# Patient Record
Sex: Female | Born: 1975 | Race: White | Hispanic: No | Marital: Married | Smoking: Former smoker
Health system: Southern US, Community
[De-identification: ages and names within clinical notes are randomized; demographics above are authoritative.]

## PROBLEM LIST (undated history)

## (undated) DIAGNOSIS — R002 Palpitations: Secondary | ICD-10-CM

## (undated) DIAGNOSIS — IMO0002 Reserved for concepts with insufficient information to code with codable children: Secondary | ICD-10-CM

## (undated) DIAGNOSIS — I493 Ventricular premature depolarization: Secondary | ICD-10-CM

## (undated) DIAGNOSIS — Z789 Other specified health status: Secondary | ICD-10-CM

## (undated) HISTORY — PX: NO PAST SURGERIES: SHX2092

## (undated) HISTORY — DX: Ventricular premature depolarization: I49.3

## (undated) HISTORY — DX: Palpitations: R00.2

---

## 2002-08-26 DIAGNOSIS — S0570XA Avulsion of unspecified eye, initial encounter: Secondary | ICD-10-CM | POA: Insufficient documentation

## 2004-01-07 ENCOUNTER — Other Ambulatory Visit: Admission: RE | Admit: 2004-01-07 | Discharge: 2004-01-07 | Payer: Self-pay | Admitting: Obstetrics and Gynecology

## 2005-02-09 ENCOUNTER — Other Ambulatory Visit: Admission: RE | Admit: 2005-02-09 | Discharge: 2005-02-09 | Payer: Self-pay | Admitting: Obstetrics and Gynecology

## 2007-07-03 ENCOUNTER — Ambulatory Visit: Payer: Self-pay | Admitting: Internal Medicine

## 2007-07-10 ENCOUNTER — Ambulatory Visit: Payer: Self-pay

## 2007-07-10 ENCOUNTER — Encounter: Payer: Self-pay | Admitting: Internal Medicine

## 2007-08-27 ENCOUNTER — Ambulatory Visit: Payer: Self-pay | Admitting: Internal Medicine

## 2009-04-13 ENCOUNTER — Encounter: Payer: Self-pay | Admitting: Internal Medicine

## 2009-07-13 ENCOUNTER — Ambulatory Visit: Payer: Self-pay | Admitting: Internal Medicine

## 2009-07-13 DIAGNOSIS — R002 Palpitations: Secondary | ICD-10-CM | POA: Insufficient documentation

## 2009-07-13 DIAGNOSIS — I4949 Other premature depolarization: Secondary | ICD-10-CM

## 2010-11-10 LAB — RUBELLA ANTIBODY, IGM: Rubella: NON-IMMUNE/NOT IMMUNE

## 2010-11-10 LAB — HIV ANTIBODY (ROUTINE TESTING W REFLEX): HIV: NONREACTIVE

## 2010-11-10 LAB — ABO/RH

## 2011-01-18 NOTE — Progress Notes (Signed)
[facility] OFFICE NOTE   NAME:Erica Carpenter, Erica Carpenter                         MRN:          [id number]  DATE:08/27/2007                            DOB:          Mar 15, 1976    The patient returns today for follow-up.  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  I saw her initially back in October.  At that  time we talked about the most likely benign nature and etiology of her  symptoms and had her undergo echocardiography to help [place] exclude  arrhythmogenic right ventricle dysplasia.  Of course, the patient has  never had syncope and has very minimal palpitations.  Two-dimensional  echocardiogram was subsequently obtained and demonstrated normal RV as  well as LV function.  She had no other specific complaints today.  I  would note that the transition from negative to positive of her PVC's  occurred at lead V3.  Of course they were strongly positive in the  inferior leads as well.  The patient is on no medications.  With regard  to her PVC's, she is on oral contraceptive pills.   PHYSICAL EXAMINATION:  GENERAL:  She is a pleasant well-appearing young  woman in no distress.  VITAL SIGNS:  Blood pressure 116/78, pulse 65 and regular, respirations  18, weight 121 pounds.  NECK:  No jugular venous distention.  LUNGS:  Clear to auscultation bilaterally.  No wheezes, rales or rhonchi  were present. CARDIOVASCULAR:  Regular rate and rhythm with normal S1  and S2.  No murmurs, rubs, or gallops present.  EXTREMITIES:  No edema bilaterally.  ABDOMEN:  Soft and nontender.  SKIN:  Normal.   IMPRESSION:  1. Palpitations.  2. Premature ventricular complexes coming from the RV outflow tract      region.   DISCUSSION:  Overall the patient is stable.  I have again reinforced the  benign etiology of her palpitations.  Should she have any  worsening  symptoms including syncope, obviously we would expect her to call us.  Otherwise, I will plan to see her back on a p.r.n. basis.  At this  point, the next treatment option would be a beta blocker, but because of  the minimal nature of her symptoms, I think the side effects of beta  blockers outweigh the benefit at this point in time.     Doylene Canning. Ladona Ridgel, MD  Electronically Signed    GWT/MedQ  DD: 08/27/2007  DT: 08/28/2007  Job #: [id number]   cc:   Dineen Kid. Rana Snare, M.D.

## 2011-01-18 NOTE — Letter (Signed)
July 03, 2007    Dineen Kid. Rana Snare, M.D.  [address]  [place], [place] [address]   RE:  Erica, Carpenter  MRN:  [id number]  /  DOB:  1975/12/25   Dear Dr. Rana Snare:   Thank you for referring Erica Carpenter for EP evaluation of fairly  frequent ventricular ectopy.  The patient is a very pleasant 35 year old  woman as you know, who has otherwise been healthy but was noted in your  office to have an irregular heartbeat.  She is minimally symptomatic  with this and notes that her only real symptom occurs when she is lying  in her bed at night time and notes that her heart beats irregularly.  Other than this though she does not have sensation of palpitations and  denies chest pain or shortness of breath and has never had syncope.  She  is very active and actually works out several times a week, doing  Runner, broadcasting/film/video and cardiovascular exercises.  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  She has no signs of  syncope and otherwise has been stable.   Her physical exam was fairly unremarkable except that she had fairly  frequent irregular heartbeats in a typically trigeminal pattern.  Her  EKG demonstrates sinus rhythm with ventricular ectopy.  The morphology  of her PVCs is that it transitions from a negative QRS to a positive QRS  in lead V3, and that the inferior leads are markedly positive consistent  with ectopy originating from the right ventricular outflow tract.   For most patients who have ectopy originating from the RV outflow tract,  they are relatively symptomatic with the exception of palpitations.  Most patients specifically are not at risk for sudden cardiac death.  With all of the above I have recommended that the patient undergo 2-D  echocardiography to help assess her risks.  Rarely patients with PVCs  originating from the RV outflow tract will ultimately end up having  arrhythmogenic right ventricular dysplasia which is a  fibrofatty  infiltration of the right ventricle.  The likelihood of this in this  patient is quite low.  Will use the echo as an initial screening tool.  If her echocardiogram is unremarkable then I will plan on seeing her  back in several months to see how she is doing and then basically on a  yearly basis thereafter until we see more or less evidence of  symptomatic arrhythmias.  Obviously if she has evidence of LV or RV  dysfunction, then additional evaluation would be required.  Thank you  for referring Erica Carpenter for EP evaluation.  I will keep you apprised of  any additional developments.    Sincerely,      Erica Carpenter. Ladona Ridgel, MD  Electronically Signed    GWT/MedQ  DD: 07/03/2007  DT: 07/03/2007  Job #: [id number]

## 2011-01-18 NOTE — Letter (Signed)
August 27, 2007    Dineen Kid. Rana Snare, MD.  [address]  [place], [place], [address].   RE:  Erica Carpenter, Erica Carpenter  MRN:  [id number]  /  DOB:  1976-08-12   Dear Dr. Rana Snare:   Today we saw Ms. Erica Carpenter back for EP followup.  As you know, she is  a very pleasant, 35 year old woman with a history of palpitations, an  irregular heartbeat, and documented PVCs coming from the RV outflow  tract region.  The patient had been and continues to be fairly minimally  symptomatic from this.  She has not had syncope, she denies chest pain  or shortness of breath.  When I initially saw her back in October, I  recommended that we undergo a two-D echocardiography to exclude an  occult erythemogenic right ventricular dysplasia (fatty infiltration of  the right ventricle) and this was carried out.  Her two-D echo  demonstrated no RV or LV dysfunction.  In addition, her chamber sizes  were all normal and there were no regional wall motion abnormalities.  All in all, a negative exam.  Today she returns for followup and her  exam is unremarkable.  Her symptoms are minimal.   I have discussed the issues with regard to treatment with [name] as  well as the expectation long term for this process.  For the most part,  patients like [name] will do well with occasional flares.  For now,  I have recommended no medical therapy, although a beta blocker if her  symptoms were to worsen would be a strong consideration.  Because her  symptoms are fairly infrequent at the present, I think a beta blocker is  unnecessary.  I will see her back on a p.r.n. basis.  Please do not  hesitate to contact me for any additional questions or problems with Ms.  Treichler.  Thanks again for referring her for EP evaluation.    Sincerely,      Doylene Canning. Ladona Ridgel, MD  Electronically Signed    GWT/MedQ  DD: 08/27/2007  DT: 08/27/2007  Job #: [id number]

## 2011-01-18 NOTE — Progress Notes (Signed)
[facility] OFFICE NOTE   NAME:WILSONAnalayah, Brooke                         MRN:          [id number]  DATE:07/03/2007                            DOB:          1976-03-31    The patient is referred today by Dineen Kid. Rana Snare, M.D. for evaluation of  an irregular heartbeat discovered on routine examination.   HISTORY OF PRESENT ILLNESS:  The patient is a 35 year old woman who has  otherwise been healthy.  She notes that when she lays in bed at night,  sometimes she will feel rare irregular heart beats, but has otherwise  been stable.  She denies chest pain, shortness of breath, or syncope.  The patient was seen by her obstetrician several days ago and was noted  to have an irregular heart beat and was at that time in what was thought  to be a bigeminal rhythm.  She is now referred for additional  evaluation.   FAMILY HISTORY:  Notable for a mother who I follow who has heart disease  and arrhythmias and a father who is otherwise alive and healthy.  She  has two brothers who are alive and healthy otherwise.   MEDICATIONS:  Oral contraceptive pills.   SOCIAL HISTORY:  The patient has a history of tobacco use, but stopped  smoking over seven years ago.  She has a rare glass of wine on daily  basis.  She drinks one cup of coffee per day.  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   REVIEW OF SYSTEMS:  Unremarkable.  All systems reviewed and found to be  negative except as noted above.   PHYSICAL EXAMINATION:  GENERAL:  She is a pleasant, well-appearing, 37-  year-old woman in no acute distress.  VITAL SIGNS:  Blood pressure 110/76, pulse was 74 and irregular.  The  respirations were 18.  Weight was 113 pounds.  HEENT:  Normocephalic and atraumatic.  Pupils equal, round, and reactive  to light.  Oropharynx was moist.  Sclerae anicteric.  NECK:  No jugular venous distention.   There was no thyromegaly.  Trachea  was midline.  Carotids were 2+ and symmetric.  LUNGS:  Clear to auscultation bilaterally.  No wheezes, rales, or  rhonchi were present.  There is no increased work of breathing.  CARDIOVASCULAR:  Irregular rate and rhythm with a normal S1 and S2.  The  PMI was not enlarged, nor was it laterally displaced.  ABDOMEN:  Soft, nontender, and nondistended.  There was no organomegaly.  The bowel sounds were present.  There was no rebound or guarding.  EXTREMITIES:  No cyanosis, clubbing, or edema.  Pulses were 2+ and  symmetric.  NEUROLOGY:  Alert and oriented x3.  Cranial nerves II-XII grossly  intact.  Strength is 5/5 and symmetric.   The EKG demonstrates sinus rhythm with frequent PVC's from the RV  outflow tract region.  The transition was negative to positive at lead  V2 going to lead V3.   IMPRESSION:  Minimally symptomatic PVC's in an RV outflow tract pattern.   DISCUSSION:  The patient is stable today and her symptoms are minimally  symptomatic.  I have recommended that we undergo two-dimensional  echocardiogram to be sure that there is not underlying structural heart  disease, specifically that she does not have evidence of right  ventricular enlargement which would be a sign of RV dysplasia.  I will  see the patient back in several weeks.  Additional recommendations will  be based on the results of her echo.     Doylene Canning. Ladona Ridgel, MD  Electronically Signed    GWT/MedQ  DD: 07/03/2007  DT: 07/03/2007  Job #: [id number]   cc:   Dineen Kid. Rana Snare, M.D.

## 2011-02-22 ENCOUNTER — Encounter: Payer: Self-pay | Admitting: Cardiovascular Disease

## 2011-05-16 LAB — STREP B DNA PROBE: GBS: NEGATIVE

## 2011-05-27 ENCOUNTER — Encounter (HOSPITAL_COMMUNITY): Payer: Self-pay

## 2011-05-27 ENCOUNTER — Inpatient Hospital Stay (HOSPITAL_COMMUNITY)
Admission: AD | Admit: 2011-05-27 | Discharge: 2011-05-30 | DRG: 373 | Disposition: A | Discharge status: Home / Self Care | Payer: BC Managed Care – PPO | Source: Ambulatory Visit | Attending: Obstetrics and Gynecology | Admitting: Obstetrics and Gynecology

## 2011-05-27 HISTORY — DX: Other specified health status: Z78.9

## 2011-05-27 HISTORY — DX: Reserved for concepts with insufficient information to code with codable children: IMO0002

## 2011-05-27 LAB — CBC
HCT: 33.3 % — ABNORMAL LOW (ref 36.0–46.0)
Hemoglobin: 11.4 g/dL — ABNORMAL LOW (ref 12.0–15.0)
MCHC: 34.2 g/dL (ref 30.0–36.0)
MCV: 84.7 fL (ref 78.0–100.0)
RDW: 13.1 % (ref 11.5–15.5)

## 2011-05-27 LAB — AMNISURE RUPTURE OF MEMBRANE (ROM) NOT AT ARMC: Amnisure ROM: POSITIVE

## 2011-05-27 MED ORDER — OXYTOCIN 20 UNITS IN LACTATED RINGERS INFUSION - SIMPLE
1.0000 m[IU]/min | INTRAVENOUS | Status: DC
  Administered 2011-05-27: 1 m[IU]/min via INTRAVENOUS
  Administered 2011-05-27: 2 m[IU]/min via INTRAVENOUS
  Administered 2011-05-28: 7 m[IU]/min via INTRAVENOUS
  Administered 2011-05-28: 5 m[IU]/min via INTRAVENOUS
  Administered 2011-05-28: 6 m[IU]/min via INTRAVENOUS

## 2011-05-27 MED ORDER — FLEET ENEMA 7-19 GM/118ML RE ENEM: 1.0000 | ENEMA | RECTAL | Status: DC | PRN

## 2011-05-27 MED ORDER — IBUPROFEN 600 MG PO TABS
600.0000 mg | ORAL_TABLET | Freq: Four times a day (QID) | ORAL | Status: DC | PRN
Start: 2011-05-27 — End: 2011-05-30
  Filled 2011-05-27 (×3): qty 1

## 2011-05-27 MED ORDER — CITRIC ACID-SODIUM CITRATE 334-500 MG/5ML PO SOLN: 30.0000 mL | ORAL | Status: DC | PRN

## 2011-05-27 MED ORDER — BUTORPHANOL TARTRATE 2 MG/ML IJ SOLN: 1.0000 mg | INTRAMUSCULAR | Status: DC | PRN

## 2011-05-27 MED ORDER — LIDOCAINE HCL (PF) 1 % IJ SOLN
30.0000 mL | INTRAMUSCULAR | Status: DC | PRN
  Filled 2011-05-27 (×2): qty 30

## 2011-05-27 MED ORDER — ONDANSETRON HCL 4 MG/2ML IJ SOLN: 4.0000 mg | Freq: Four times a day (QID) | INTRAMUSCULAR | Status: DC | PRN

## 2011-05-27 MED ORDER — OXYTOCIN 20 UNITS IN LACTATED RINGERS INFUSION - SIMPLE
125.0000 mL/h | Freq: Once | INTRAVENOUS | Status: AC
  Administered 2011-05-28: 999 mL/h via INTRAVENOUS

## 2011-05-27 MED ORDER — OXYTOCIN BOLUS FROM INFUSION
500.0000 mL | Freq: Once | INTRAVENOUS | Status: DC
  Filled 2011-05-27: qty 500
  Filled 2011-05-27: qty 1000

## 2011-05-27 MED ORDER — OXYCODONE-ACETAMINOPHEN 5-325 MG PO TABS: 2.0000 | ORAL_TABLET | ORAL | Status: DC | PRN

## 2011-05-27 MED ORDER — TERBUTALINE SULFATE 1 MG/ML IJ SOLN: 0.2500 mg | Freq: Once | INTRAMUSCULAR | Status: AC | PRN

## 2011-05-27 MED ORDER — LACTATED RINGERS IV SOLN
INTRAVENOUS | Status: DC
  Administered 2011-05-27: 22:00:00 via INTRAVENOUS
  Administered 2011-05-27: 125 mL/h via INTRAVENOUS
  Administered 2011-05-28: 04:00:00 via INTRAVENOUS

## 2011-05-27 MED ORDER — LACTATED RINGERS IV SOLN: 500.0000 mL | INTRAVENOUS | Status: DC | PRN

## 2011-05-27 MED ORDER — ACETAMINOPHEN 325 MG PO TABS: 650.0000 mg | ORAL_TABLET | ORAL | Status: DC | PRN

## 2011-05-27 NOTE — Progress Notes (Signed)
FHT reactive UCs q2-4 min Pit on Cx 2/90/-1

## 2011-05-27 NOTE — H&P (Signed)
Erica Carpenter is a 35 y.o. female presenting for G1P0 at 49 2/7 weeks with SROM.  Some UCs.  No HA, vision change. Maternal Medical History:  Reason for admission: Reason for admission: rupture of membranes.  Contractions: Frequency: irregular.   Perceived severity is moderate.      OB History    Grav Para Term Preterm Abortions TAB SAB Ect Mult Living   1              Past Medical History  Diagnosis Date  . Palpitations   . PVC's (premature ventricular contractions)    Past Surgical History  Procedure Date  . No past surgeries    Family History: family history is not on file. Social History:  reports that she has quit smoking. She does not have any smokeless tobacco history on file. She reports that she drinks alcohol. She reports that she does not use illicit drugs.  Review of Systems  Eyes: Negative for blurred vision.  Neurological: Negative for headaches.    Dilation: 1 Effacement (%): 50 Station: -3 Exam by:: V SMITH CNM  Blood pressure 126/66, pulse 83, temperature 98.7 F (37.1 C), temperature source Oral, resp. rate 20, height 5\' 1"  (1.549 m), weight 71.668 kg (158 lb). Maternal Exam:  Uterine Assessment: Contraction strength is moderate.  Contraction frequency is irregular.   Abdomen: Patient reports no abdominal tenderness.   Fetal Exam Fetal Monitor Review: Pattern: accelerations present.       Physical Exam  Constitutional: She appears well-developed and well-nourished.  Cardiovascular: Normal rate and regular rhythm.   Respiratory: Effort normal and breath sounds normal.    Prenatal labs: ABO, Rh:   Antibody:   Rubella:   RPR:    HBsAg:    HIV:    GBS:     Assessment/Plan: 35 yo G1P0 at 55 2/7weeks with SROM and irreg ucs. Will start low dose pitocin augmentation. GBBS neg   Merranda Bolls II,Kharlie Bring E 05/27/2011, 4:58 PM

## 2011-05-27 NOTE — Progress Notes (Signed)
Pt state she had a gush of thick mucus about one hour ago. Has not continued to leak but her shorts and panties are slightly wet. Reports good movement.

## 2011-05-27 NOTE — Progress Notes (Signed)
FHT reactive UCs q3-4 min Cx 2/90/-2 Pit on

## 2011-05-27 NOTE — ED Provider Notes (Signed)
The patient is a 35 y.o. year old G1P0 female at [redacted]w[redacted]d weeks gestation who presents to [facility] reporting spontaneous rupture of membranes and regular mild-mod UC's. She reports pos FM and denies VB.  FHR:12-13 , category I Toco: Q3-4, mild-mod Dilation: 1 Effacement (%): 50 Station: -3 Presentation: Vertex Exam by:: V Arjay Jaskiewicz CNM  Pool: neg Fern: equivocal Amnisure: pos  Assessment: 1. 37.2 week IUP, SROM clear 2. Early labor 3. FHR category I  Plan: 1. Admit to BS per consult w/ Dr. Henderson Cloud 2. Dr. Henderson Cloud to assume care of pt.

## 2011-05-27 NOTE — Progress Notes (Signed)
FHT reactive UCs irreg Pitocin @ 6cc/hr

## 2011-05-28 ENCOUNTER — Inpatient Hospital Stay (HOSPITAL_COMMUNITY): Payer: BC Managed Care – PPO | Admitting: Anesthesiology

## 2011-05-28 ENCOUNTER — Encounter (HOSPITAL_COMMUNITY): Payer: Self-pay | Admitting: Anesthesiology

## 2011-05-28 ENCOUNTER — Encounter (HOSPITAL_COMMUNITY): Payer: Self-pay | Admitting: Obstetrics and Gynecology

## 2011-05-28 MED ORDER — SENNOSIDES-DOCUSATE SODIUM 8.6-50 MG PO TABS
2.0000 | ORAL_TABLET | Freq: Every day | ORAL | Status: DC
  Administered 2011-05-28 – 2011-05-29 (×2): 2 via ORAL

## 2011-05-28 MED ORDER — WITCH HAZEL-GLYCERIN EX PADS: 1.0000 "application " | MEDICATED_PAD | CUTANEOUS | Status: DC | PRN

## 2011-05-28 MED ORDER — EPHEDRINE 5 MG/ML INJ
10.0000 mg | INTRAVENOUS | Status: DC | PRN
  Filled 2011-05-28 (×2): qty 4

## 2011-05-28 MED ORDER — SODIUM BICARBONATE 8.4 % IV SOLN
INTRAVENOUS | Status: DC | PRN
  Administered 2011-05-28: 3 mL via EPIDURAL

## 2011-05-28 MED ORDER — LANOLIN HYDROUS EX OINT: TOPICAL_OINTMENT | CUTANEOUS | Status: DC | PRN

## 2011-05-28 MED ORDER — BISACODYL 10 MG RE SUPP: 10.0000 mg | Freq: Every day | RECTAL | Status: DC | PRN

## 2011-05-28 MED ORDER — PHENYLEPHRINE 40 MCG/ML (10ML) SYRINGE FOR IV PUSH (FOR BLOOD PRESSURE SUPPORT)
80.0000 ug | PREFILLED_SYRINGE | INTRAVENOUS | Status: DC | PRN
  Filled 2011-05-28: qty 5

## 2011-05-28 MED ORDER — PHENYLEPHRINE 40 MCG/ML (10ML) SYRINGE FOR IV PUSH (FOR BLOOD PRESSURE SUPPORT)
80.0000 ug | PREFILLED_SYRINGE | INTRAVENOUS | Status: DC | PRN
  Filled 2011-05-28 (×2): qty 5

## 2011-05-28 MED ORDER — IBUPROFEN 600 MG PO TABS
600.0000 mg | ORAL_TABLET | Freq: Four times a day (QID) | ORAL | Status: DC
  Administered 2011-05-28 – 2011-05-30 (×7): 600 mg via ORAL
  Filled 2011-05-28 (×5): qty 1

## 2011-05-28 MED ORDER — DIPHENHYDRAMINE HCL 50 MG/ML IJ SOLN: 12.5000 mg | INTRAMUSCULAR | Status: DC | PRN

## 2011-05-28 MED ORDER — DIBUCAINE 1 % RE OINT: 1.0000 "application " | TOPICAL_OINTMENT | RECTAL | Status: DC | PRN

## 2011-05-28 MED ORDER — ZOLPIDEM TARTRATE 5 MG PO TABS: 5.0000 mg | ORAL_TABLET | Freq: Every evening | ORAL | Status: DC | PRN

## 2011-05-28 MED ORDER — BENZOCAINE-MENTHOL 20-0.5 % EX AERO
1.0000 "application " | INHALATION_SPRAY | CUTANEOUS | Status: DC | PRN
  Administered 2011-05-29: 1 via TOPICAL

## 2011-05-28 MED ORDER — EPHEDRINE 5 MG/ML INJ
10.0000 mg | INTRAVENOUS | Status: DC | PRN
  Filled 2011-05-28: qty 4

## 2011-05-28 MED ORDER — TETANUS-DIPHTH-ACELL PERTUSSIS 5-2.5-18.5 LF-MCG/0.5 IM SUSP: 0.5000 mL | Freq: Once | INTRAMUSCULAR | Status: DC

## 2011-05-28 MED ORDER — LACTATED RINGERS IV SOLN: 500.0000 mL | Freq: Once | INTRAVENOUS | Status: DC

## 2011-05-28 MED ORDER — DIPHENHYDRAMINE HCL 25 MG PO CAPS: 25.0000 mg | ORAL_CAPSULE | Freq: Four times a day (QID) | ORAL | Status: DC | PRN

## 2011-05-28 MED ORDER — FLEET ENEMA 7-19 GM/118ML RE ENEM: 1.0000 | ENEMA | RECTAL | Status: DC | PRN

## 2011-05-28 MED ORDER — ONDANSETRON HCL 4 MG/2ML IJ SOLN: 4.0000 mg | INTRAMUSCULAR | Status: DC | PRN

## 2011-05-28 MED ORDER — PRENATAL PLUS 27-1 MG PO TABS
1.0000 | ORAL_TABLET | Freq: Every day | ORAL | Status: DC
  Administered 2011-05-28 – 2011-05-30 (×3): 1 via ORAL
  Filled 2011-05-28 (×3): qty 1

## 2011-05-28 MED ORDER — BENZOCAINE-MENTHOL 20-0.5 % EX AERO
INHALATION_SPRAY | CUTANEOUS | Status: AC
  Filled 2011-05-28: qty 56

## 2011-05-28 MED ORDER — ONDANSETRON HCL 4 MG PO TABS: 4.0000 mg | ORAL_TABLET | ORAL | Status: DC | PRN

## 2011-05-28 MED ORDER — FENTANYL 2.5 MCG/ML BUPIVACAINE 1/10 % EPIDURAL INFUSION (WH - ANES)
14.0000 mL/h | INTRAMUSCULAR | Status: DC
  Administered 2011-05-28: 12 mL/h via EPIDURAL
  Administered 2011-05-28: 14 mL/h via EPIDURAL
  Filled 2011-05-28 (×3): qty 60

## 2011-05-28 MED ORDER — OXYCODONE-ACETAMINOPHEN 5-325 MG PO TABS: 1.0000 | ORAL_TABLET | ORAL | Status: DC | PRN

## 2011-05-28 MED ORDER — SIMETHICONE 80 MG PO CHEW: 80.0000 mg | CHEWABLE_TABLET | ORAL | Status: DC | PRN

## 2011-05-28 NOTE — Progress Notes (Signed)
Delivery SVD VFI   Apgars  8/9 Wt 6#  2.8oz Art pH pending Placenta 3 vessels intact EBL  400 cc Cx intact Second degree rt sulcus lac-repaired Rectum checked-intact Pt / infant stable in LDR

## 2011-05-28 NOTE — Progress Notes (Signed)
FHT reactive UCs q3 min Cx small rt rim/C/+1

## 2011-05-28 NOTE — Anesthesia Preprocedure Evaluation (Addendum)
Anesthesia Evaluation  Name, MR# and DOB Patient awake  General Assessment Comment  Reviewed: Allergy & Precautions, H&P , Patient's Chart, lab work & pertinent test results  Airway Mallampati: III TM Distance: <3 FB Neck ROM: full  Mouth opening: Limited Mouth Opening  Dental  (+) Teeth Intact   Pulmonary  clear to auscultation  breath sounds clear to auscultation none    Cardiovascular - dysrhythmias (hx of PVC's) regular Normal    Neuro/Psych   GI/Hepatic/Renal   Endo/Other    Abdominal   Musculoskeletal   Hematology   Peds  Reproductive/Obstetrics (+) Pregnancy    Anesthesia Other Findings                Anesthesia Physical Anesthesia Plan  ASA: II  Anesthesia Plan: Epidural   Post-op Pain Management:    Induction:   Airway Management Planned:   Additional Equipment:   Intra-op Plan:   Post-operative Plan:   Informed Consent: I have reviewed the patients History and Physical, chart, labs and discussed the procedure including the risks, benefits and alternatives for the proposed anesthesia with the patient or authorized representative who has indicated his/her understanding and acceptance.   Dental Advisory Given  Plan Discussed with: CRNA and Surgeon  Anesthesia Plan Comments: (Labs checked- platelets confirmed with RN in room. Fetal heart tracing, per RN, reportedly stable enough for sitting procedure. Discussed epidural, and patient consents to the procedure:  included risk of possible headache,backache, failed block, allergic reaction, and nerve injury. This patient was asked if she had any questions or concerns before the procedure started. )        Anesthesia Quick Evaluation

## 2011-05-28 NOTE — Anesthesia Procedure Notes (Signed)
Epidural Patient location during procedure: OB Start time: 05/28/2011 2:43 AM  Staffing Anesthesiologist: Jiles Garter  Preanesthetic Checklist Completed: patient identified, site marked, surgical consent, pre-op evaluation, timeout performed, IV checked, risks and benefits discussed and monitors and equipment checked  Epidural Patient position: sitting Prep: site prepped and draped and DuraPrep Patient monitoring: continuous pulse ox and blood pressure Approach: midline Injection technique: LOR air  Needle:  Needle type: Tuohy  Needle gauge: 17 G Needle length: 9 cm Needle insertion depth: 5 cm cm Catheter type: closed end flexible Catheter size: 19 Gauge Catheter at skin depth: 10 cm Test dose: negative  Assessment Events: blood not aspirated, injection not painful, no injection resistance, negative IV test and no paresthesia  Additional Notes Dosing of Epidural: 1st dose, through needle...... ( mg expressed as equavilent  cc's medication  from .1%Bupiv / fentanyl syringe from L&D pump)...............  4mg  Marcaine  2nd dose, through catheter after waiting 3 minutes.... epi 1:200K + Xylocaine 40 mg 3rd dose, through catheter, after waiting 3 minutes...Marland KitchenMarland Kitchenepi 1:200K + Xylocaine 20 mg ( 2% Xylo charted as a single dose in Epic Meds for ease of charting; actual dosing was fractionated as above, for saftey's sake)  As each dose occurred, patient was free of IV sx; and patient exhibited no evidence of SA injection.  Patient is more comfortable after epidural dosed. Please see RN's note for documentation of vital signs,and FHR which are stable.

## 2011-05-29 LAB — CBC
Hemoglobin: 10.6 g/dL — ABNORMAL LOW (ref 12.0–15.0)
MCH: 29 pg (ref 26.0–34.0)
MCHC: 34 g/dL (ref 30.0–36.0)
Platelets: 150 10*3/uL (ref 150–400)

## 2011-05-29 MED ORDER — BENZOCAINE-MENTHOL 20-0.5 % EX AERO
INHALATION_SPRAY | CUTANEOUS | Status: AC
  Administered 2011-05-29: 1 via TOPICAL
  Filled 2011-05-29: qty 56

## 2011-05-29 NOTE — Anesthesia Postprocedure Evaluation (Signed)
Anesthesia Post Note  Patient: Erica Carpenter  Procedure(s) Performed: * No procedures listed *  Anesthesia type: Epidural  Patient location: Mother/Baby  Post pain: Pain level controlled  Post assessment: Post-op Vital signs reviewed  Last Vitals:  Filed Vitals:   05/29/11 0233  BP: 111/74  Pulse: 84  Temp: 97.8 F (36.6 C)  Resp: 16    Post vital signs: Reviewed  Level of consciousness: awake  Complications: No apparent anesthesia complications

## 2011-05-29 NOTE — Progress Notes (Signed)
No C/O Decreasing lochia, voiding, ambulating  VSS Afeb FFNT  Satisfactory

## 2011-05-30 MED ORDER — INFLUENZA VIRUS VACC SPLIT PF IM SUSP
0.5000 mL | Freq: Once | INTRAMUSCULAR | Status: AC
  Administered 2011-05-30: 0.5 mL via INTRAMUSCULAR
  Filled 2011-05-30: qty 0.5

## 2011-05-30 MED ORDER — IBUPROFEN 600 MG PO TABS: 600.0000 mg | ORAL_TABLET | Freq: Four times a day (QID) | ORAL | Status: AC | PRN

## 2011-05-30 NOTE — Discharge Summary (Signed)
Obstetric Discharge Summary Reason for Admission: rupture of membranes Prenatal Procedures: none Intrapartum Procedures: spontaneous vaginal delivery Postpartum Procedures: none Complications-Operative and Postpartum: none Hemoglobin  Date Value Range Status  05/29/2011 10.6* 12.0-15.0 (g/dL) Final     HCT  Date Value Range Status  05/29/2011 31.2* 36.0-46.0 (%) Final    Discharge Diagnoses: Term Pregnancy-delivered  Discharge Information: Date: 05/30/2011 Activity: pelvic rest Diet: routine Medications: Ibuprophen Condition: stable Instructions: refer to practice specific booklet Discharge to: home   Newborn Data: Live born female  Birth Weight: 6 lb 2.9 oz (2805 g) APGAR: 8, 9  Home with mother.  Loretta Kluender L 05/30/2011, 7:42 AM

## 2011-05-30 NOTE — Progress Notes (Signed)
UR chart review completed.  

## 2011-05-30 NOTE — Progress Notes (Signed)
Post Partum Day 2 Subjective: no complaints  Objective: Blood pressure 105/70, pulse 102, temperature 98 F (36.7 C), temperature source Oral, resp. rate 18, height 5\' 1"  (1.549 m), weight 71.668 kg (158 lb), SpO2 99.00%, unknown if currently breastfeeding.  Physical Exam:  General: alert Lochia: appropriate Uterine Fundus: firm Incision: none DVT Evaluation: No evidence of DVT seen on physical exam.   Basename 05/29/11 0521 05/27/11 1815  HGB 10.6* 11.4*  HCT 31.2* 33.3*    Assessment/Plan: Discharge home   LOS: 3 days   Bernon Arviso L 05/30/2011, 7:42 AM

## 2011-06-02 ENCOUNTER — Encounter (HOSPITAL_COMMUNITY): Payer: BC Managed Care – PPO

## 2012-12-13 ENCOUNTER — Ambulatory Visit (INDEPENDENT_AMBULATORY_CARE_PROVIDER_SITE_OTHER): Payer: BC Managed Care – PPO | Admitting: Internal Medicine

## 2012-12-13 ENCOUNTER — Ambulatory Visit: Payer: BC Managed Care – PPO | Admitting: Internal Medicine

## 2012-12-13 ENCOUNTER — Encounter: Payer: Self-pay | Admitting: Internal Medicine

## 2012-12-13 VITALS — BP 110/72 | HR 70 | Ht 61.0 in | Wt 130.0 lb

## 2012-12-13 DIAGNOSIS — I4949 Other premature depolarization: Secondary | ICD-10-CM

## 2012-12-13 DIAGNOSIS — R002 Palpitations: Secondary | ICD-10-CM

## 2012-12-13 NOTE — Patient Instructions (Addendum)
Follow up with Dr. Ladona Ridgel in 2 years.  Continue with current medications.

## 2012-12-13 NOTE — Assessment & Plan Note (Signed)
The patient's symptoms are minimal if not absent. I've recommended watchful waiting. If she has recurrent symptoms, she is instructed to call us, otherwise we will see her in approximately 2 years. No change in medical therapy. I've encouraged the patient to exercise.

## 2012-12-13 NOTE — Progress Notes (Signed)
HPI [name] returns today for followup. She is a very pleasant 37 year old woman with a history of symptomatic palpitations do to premature ventricular contractions. She has preserved left ventricular function. The patient has a strong family history for congestive heart failure and cardiomyopathy. The patient has been absent from our arrhythmia clinic for several years. During that time, she had a successful pregnancy and vaginal delivery, without complication. The patient exercises several times a week without limitation. She denies chest pain or shortness of breath. She has had no syncope, palpitations, or peripheral edema. No Known Allergies   Current Outpatient Prescriptions  Medication Sig Dispense Refill  . ibuprofen (ADVIL,MOTRIN) 200 MG tablet Take 200 mg by mouth every 6 (six) hours as needed for pain.      . prenatal vitamin w/FE, FA (PRENATAL 1 + 1) 27-1 MG TABS Take 1 tablet by mouth daily.         No current facility-administered medications for this visit.     Past Medical History  Diagnosis Date  . Palpitations   . PVC's (premature ventricular contractions)   . Abnormal Pap smear HX OF LEEP   . No pertinent past medical history   . Vaginal delivery 05/28/2011    ROS:   All systems reviewed and negative except as noted in the HPI.   Past Surgical History  Procedure Laterality Date  . No past surgeries       History reviewed. No pertinent family history.   History   Social History  . Marital Status: Married    Spouse Name: N/A    Number of Children: N/A  . Years of Education: N/A   Occupational History  . Not on file.   Social History Main Topics  . Smoking status: Former Games developer  . Smokeless tobacco: Not on file  . Alcohol Use: Yes  . Drug Use: No  . Sexually Active: Yes   Other Topics Concern  . Not on file   Social History Narrative   Full time, married, gets regular exercise .      BP 110/72  Pulse 70  Ht 5\' 1"  (1.549 m)  Wt 130 lb  (58.968 kg)  BMI 24.58 kg/m2  Physical Exam:  Well appearing 37 year old woman,NAD HEENT: Unremarkable Neck:  No JVD, no thyromegally Back:  No CVA tenderness Lungs:  Clear with no wheezes, rales, or rhonchi. HEART:  Regular rate rhythm, no murmurs, no rubs, no clicks Abd:  soft, positive bowel sounds, no organomegally, no rebound, no guarding Ext:  2 plus pulses, no edema, no cyanosis, no clubbing Skin:  No rashes no nodules Neuro:  CN II through XII intact, motor grossly intact  EKG - normal sinus rhythm, with normal axis and intervals.  Assess/Plan:

## 2013-09-05 NOTE — L&D Delivery Note (Signed)
Delivery Note At 7:34 AM a viable female was delivered via NSVD (Presentation:VTX ;  ).  APGAR: 8/9, ; weight  .   Placenta status:INTACT, .3 VESSEL  Cord:  with the following complications:NONE .  Cord pH: NA  Anesthesia: Epidural  Episiotomy: NONE  Lacerations:  SECOND DEGREE Suture Repair: 2.0 chromic Est. Blood Loss (mL):350    Mom to postpartum.  Baby to Couplet care / Skin to Skin.  Devery Odwyer S 08/12/2014, 7:44 AM

## 2014-01-07 LAB — OB RESULTS CONSOLE ANTIBODY SCREEN: ANTIBODY SCREEN: NEGATIVE

## 2014-01-07 LAB — OB RESULTS CONSOLE RPR: RPR: NONREACTIVE

## 2014-01-07 LAB — OB RESULTS CONSOLE ABO/RH: RH Type: POSITIVE

## 2014-01-07 LAB — OB RESULTS CONSOLE HIV ANTIBODY (ROUTINE TESTING): HIV: NONREACTIVE

## 2014-01-07 LAB — OB RESULTS CONSOLE HEPATITIS B SURFACE ANTIGEN: Hepatitis B Surface Ag: NEGATIVE

## 2014-01-07 LAB — OB RESULTS CONSOLE RUBELLA ANTIBODY, IGM: Rubella: IMMUNE

## 2014-01-21 LAB — OB RESULTS CONSOLE GC/CHLAMYDIA
CHLAMYDIA, DNA PROBE: NEGATIVE
GC PROBE AMP, GENITAL: NEGATIVE

## 2014-07-07 ENCOUNTER — Encounter: Payer: Self-pay | Admitting: Internal Medicine

## 2014-07-15 LAB — OB RESULTS CONSOLE GBS: GBS: NEGATIVE

## 2014-08-12 ENCOUNTER — Inpatient Hospital Stay (HOSPITAL_COMMUNITY): Payer: BC Managed Care – PPO | Admitting: Anesthesiology

## 2014-08-12 ENCOUNTER — Encounter (HOSPITAL_COMMUNITY): Payer: Self-pay | Admitting: *Deleted

## 2014-08-12 ENCOUNTER — Inpatient Hospital Stay (HOSPITAL_COMMUNITY)
Admission: AD | Admit: 2014-08-12 | Discharge: 2014-08-13 | DRG: 775 | Disposition: A | Discharge status: Home / Self Care | Payer: BC Managed Care – PPO | Source: Ambulatory Visit | Attending: Obstetrics and Gynecology | Admitting: Obstetrics and Gynecology

## 2014-08-12 DIAGNOSIS — Z3A39 39 weeks gestation of pregnancy: Secondary | ICD-10-CM | POA: Diagnosis present

## 2014-08-12 DIAGNOSIS — O09523 Supervision of elderly multigravida, third trimester: Secondary | ICD-10-CM

## 2014-08-12 LAB — CBC
HCT: 34.6 % — ABNORMAL LOW (ref 36.0–46.0)
Hemoglobin: 12.1 g/dL (ref 12.0–15.0)
MCH: 29.2 pg (ref 26.0–34.0)
MCHC: 35 g/dL (ref 30.0–36.0)
MCV: 83.6 fL (ref 78.0–100.0)
Platelets: 181 10*3/uL (ref 150–400)
RBC: 4.14 MIL/uL (ref 3.87–5.11)
RDW: 13 % (ref 11.5–15.5)
WBC: 9.4 10*3/uL (ref 4.0–10.5)

## 2014-08-12 LAB — TYPE AND SCREEN
ABO/RH(D): O POS
ANTIBODY SCREEN: NEGATIVE

## 2014-08-12 LAB — RPR

## 2014-08-12 MED ORDER — OXYCODONE-ACETAMINOPHEN 5-325 MG PO TABS: 2.0000 | ORAL_TABLET | ORAL | Status: DC | PRN

## 2014-08-12 MED ORDER — ONDANSETRON HCL 4 MG/2ML IJ SOLN: 4.0000 mg | Freq: Four times a day (QID) | INTRAMUSCULAR | Status: DC | PRN

## 2014-08-12 MED ORDER — LANOLIN HYDROUS EX OINT: TOPICAL_OINTMENT | CUTANEOUS | Status: DC | PRN

## 2014-08-12 MED ORDER — PHENYLEPHRINE 40 MCG/ML (10ML) SYRINGE FOR IV PUSH (FOR BLOOD PRESSURE SUPPORT)
80.0000 ug | PREFILLED_SYRINGE | INTRAVENOUS | Status: DC | PRN
  Filled 2014-08-12: qty 10
  Filled 2014-08-12: qty 2

## 2014-08-12 MED ORDER — WITCH HAZEL-GLYCERIN EX PADS: 1.0000 "application " | MEDICATED_PAD | CUTANEOUS | Status: DC | PRN

## 2014-08-12 MED ORDER — DIBUCAINE 1 % RE OINT: 1.0000 "application " | TOPICAL_OINTMENT | RECTAL | Status: DC | PRN

## 2014-08-12 MED ORDER — PROMETHAZINE HCL 25 MG/ML IJ SOLN: 12.5000 mg | Freq: Once | INTRAMUSCULAR | Status: DC

## 2014-08-12 MED ORDER — OXYTOCIN BOLUS FROM INFUSION
500.0000 mL | INTRAVENOUS | Status: DC
  Administered 2014-08-12: 500 mL via INTRAVENOUS

## 2014-08-12 MED ORDER — PHENYLEPHRINE 40 MCG/ML (10ML) SYRINGE FOR IV PUSH (FOR BLOOD PRESSURE SUPPORT)
80.0000 ug | PREFILLED_SYRINGE | INTRAVENOUS | Status: DC | PRN
  Filled 2014-08-12: qty 2

## 2014-08-12 MED ORDER — TETANUS-DIPHTH-ACELL PERTUSSIS 5-2.5-18.5 LF-MCG/0.5 IM SUSP: 0.5000 mL | Freq: Once | INTRAMUSCULAR | Status: DC

## 2014-08-12 MED ORDER — OXYCODONE-ACETAMINOPHEN 5-325 MG PO TABS
1.0000 | ORAL_TABLET | ORAL | Status: DC | PRN
Start: 2014-08-12 — End: 2014-08-13

## 2014-08-12 MED ORDER — OXYTOCIN 40 UNITS IN LACTATED RINGERS INFUSION - SIMPLE MED
62.5000 mL/h | INTRAVENOUS | Status: DC
  Filled 2014-08-12: qty 1000

## 2014-08-12 MED ORDER — EPHEDRINE 5 MG/ML INJ
10.0000 mg | INTRAVENOUS | Status: DC | PRN
  Filled 2014-08-12: qty 2

## 2014-08-12 MED ORDER — SENNOSIDES-DOCUSATE SODIUM 8.6-50 MG PO TABS
2.0000 | ORAL_TABLET | ORAL | Status: DC
  Administered 2014-08-13: 2 via ORAL
  Filled 2014-08-12: qty 2

## 2014-08-12 MED ORDER — FLEET ENEMA 7-19 GM/118ML RE ENEM: 1.0000 | ENEMA | RECTAL | Status: DC | PRN

## 2014-08-12 MED ORDER — IBUPROFEN 600 MG PO TABS
600.0000 mg | ORAL_TABLET | Freq: Four times a day (QID) | ORAL | Status: DC
  Administered 2014-08-12 – 2014-08-13 (×5): 600 mg via ORAL
  Filled 2014-08-12 (×5): qty 1

## 2014-08-12 MED ORDER — FLEET ENEMA 7-19 GM/118ML RE ENEM: 1.0000 | ENEMA | Freq: Every day | RECTAL | Status: DC | PRN

## 2014-08-12 MED ORDER — OXYCODONE-ACETAMINOPHEN 5-325 MG PO TABS: 1.0000 | ORAL_TABLET | ORAL | Status: DC | PRN

## 2014-08-12 MED ORDER — FENTANYL 2.5 MCG/ML BUPIVACAINE 1/10 % EPIDURAL INFUSION (WH - ANES)
INTRAMUSCULAR | Status: DC | PRN
  Administered 2014-08-12: 14 mL/h via EPIDURAL

## 2014-08-12 MED ORDER — ONDANSETRON HCL 4 MG PO TABS: 4.0000 mg | ORAL_TABLET | ORAL | Status: DC | PRN

## 2014-08-12 MED ORDER — ONDANSETRON HCL 4 MG/2ML IJ SOLN: 4.0000 mg | INTRAMUSCULAR | Status: DC | PRN

## 2014-08-12 MED ORDER — BISACODYL 10 MG RE SUPP: 10.0000 mg | Freq: Every day | RECTAL | Status: DC | PRN

## 2014-08-12 MED ORDER — PRENATAL MULTIVITAMIN CH
1.0000 | ORAL_TABLET | Freq: Every day | ORAL | Status: DC
  Administered 2014-08-12 – 2014-08-13 (×2): 1 via ORAL
  Filled 2014-08-12 (×2): qty 1

## 2014-08-12 MED ORDER — LACTATED RINGERS IV SOLN: 500.0000 mL | Freq: Once | INTRAVENOUS | Status: DC

## 2014-08-12 MED ORDER — LACTATED RINGERS IV SOLN: 500.0000 mL | INTRAVENOUS | Status: DC | PRN

## 2014-08-12 MED ORDER — DIPHENHYDRAMINE HCL 25 MG PO CAPS: 25.0000 mg | ORAL_CAPSULE | Freq: Four times a day (QID) | ORAL | Status: DC | PRN

## 2014-08-12 MED ORDER — ZOLPIDEM TARTRATE 5 MG PO TABS: 5.0000 mg | ORAL_TABLET | Freq: Every evening | ORAL | Status: DC | PRN

## 2014-08-12 MED ORDER — BENZOCAINE-MENTHOL 20-0.5 % EX AERO
1.0000 "application " | INHALATION_SPRAY | CUTANEOUS | Status: DC | PRN
  Administered 2014-08-12: 1 via TOPICAL
  Filled 2014-08-12: qty 56

## 2014-08-12 MED ORDER — CITRIC ACID-SODIUM CITRATE 334-500 MG/5ML PO SOLN: 30.0000 mL | ORAL | Status: DC | PRN

## 2014-08-12 MED ORDER — FENTANYL 2.5 MCG/ML BUPIVACAINE 1/10 % EPIDURAL INFUSION (WH - ANES)
14.0000 mL/h | INTRAMUSCULAR | Status: DC | PRN
  Administered 2014-08-12: 14 mL/h via EPIDURAL
  Filled 2014-08-12: qty 125

## 2014-08-12 MED ORDER — ACETAMINOPHEN 325 MG PO TABS: 650.0000 mg | ORAL_TABLET | ORAL | Status: DC | PRN

## 2014-08-12 MED ORDER — BUTORPHANOL TARTRATE 1 MG/ML IJ SOLN: 1.0000 mg | Freq: Once | INTRAMUSCULAR | Status: DC

## 2014-08-12 MED ORDER — LACTATED RINGERS IV SOLN
INTRAVENOUS | Status: DC
Start: 2014-08-12 — End: 2014-08-12
  Administered 2014-08-12: 06:00:00 via INTRAVENOUS

## 2014-08-12 MED ORDER — LIDOCAINE HCL (PF) 1 % IJ SOLN
INTRAMUSCULAR | Status: DC | PRN
  Administered 2014-08-12 (×2): 9 mL

## 2014-08-12 MED ORDER — DIPHENHYDRAMINE HCL 50 MG/ML IJ SOLN: 12.5000 mg | INTRAMUSCULAR | Status: DC | PRN

## 2014-08-12 MED ORDER — SIMETHICONE 80 MG PO CHEW: 80.0000 mg | CHEWABLE_TABLET | ORAL | Status: DC | PRN

## 2014-08-12 MED ORDER — LIDOCAINE HCL (PF) 1 % IJ SOLN
30.0000 mL | INTRAMUSCULAR | Status: DC | PRN
  Filled 2014-08-12: qty 30

## 2014-08-12 NOTE — Lactation Note (Signed)
This note was copied from the chart of Erica Carpenter. Lactation Consultation Note  Patient Name: Erica Mekesha Macinnes NOMVE'H Date: 08/12/2014 Reason for consult: Initial assessment of this mom and baby at 14 hours pp. Mom breastfed her older child for 4 months and states that her newborn has latched on well since delivery.  LC encouraged cue feedings but at time of visit, baby asleep in open crib (on her back) and both parents trying to fall asleep.  RN, Morrie Sheldon had documented teaching hand expression on mother's education (under BF section) and baby has had 4 breastfeedings with [facility] scores=7/8 per RN assessment.     Maternal Data Formula Feeding for Exclusion: No Has patient been taught Hand Expression?: Yes (per RN, Morrie Sheldon as documented on teaching of mother (breastfeeding section)) Does the patient have breastfeeding experience prior to this delivery?: Yes  Feeding Feeding Type: Breast Fed  LATCH Score/Interventions Latch: Repeated attempts needed to sustain latch, nipple held in mouth throughout feeding, stimulation needed to elicit sucking reflex. Intervention(s): Adjust position  Audible Swallowing: Spontaneous and intermittent  Type of Nipple: Everted at rest and after stimulation  Comfort (Breast/Nipple): Soft / non-tender     Hold (Positioning): Assistance needed to correctly position infant at breast and maintain latch.  LATCH Score: 8 (previous feeding assessment, per RN)  Lactation Tools Discussed/Used   Cue feedings Normal newborn sleepiness  Consult Status Consult Status: Follow-up Date: 08/13/14 Follow-up type: In-patient    Warrick [facility] 08/12/2014, 9:57 PM

## 2014-08-12 NOTE — MAU Note (Signed)
p presents with complaint of contractions.

## 2014-08-12 NOTE — Plan of Care (Signed)
Problem: Phase II Progression Outcomes Goal: Initiate breastfeeding within 1hr delivery Outcome: Completed/Met Date Met:  08/12/14

## 2014-08-12 NOTE — Anesthesia Postprocedure Evaluation (Signed)
  Anesthesia Post-op Note  Patient: Erica Carpenter  Procedure(s) Performed: * No procedures listed *  Patient Location: Mother/Baby  Anesthesia Type:Epidural  Level of Consciousness: awake, alert  and oriented  Airway and Oxygen Therapy: Patient Spontanous Breathing  Post-op Pain: mild  Post-op Assessment: Patient's Cardiovascular Status Stable, Respiratory Function Stable, No headache, No backache, No residual numbness and No residual motor weakness  Post-op Vital Signs: stable  Last Vitals:  Filed Vitals:   08/12/14 1055  BP: 123/77  Pulse: 88  Temp: 36.9 C  Resp: 20    Complications: No apparent anesthesia complications

## 2014-08-12 NOTE — H&P (Signed)
Dannae Ibbotson is a 38 y.o. female presenting at 36+ with SOL.  Negative  GBS> Maternal Medical History:  Reason for admission: Contractions.   Contractions: Onset was 1-2 hours ago.   Frequency: regular.   Perceived severity is moderate.    Fetal activity: Perceived fetal activity is normal.    Prenatal complications: no prenatal complications Prenatal Complications - Diabetes: none.    OB History    Gravida Para Term Preterm AB TAB SAB Ectopic Multiple Living   2 1 1       1      Past Medical History  Diagnosis Date  . Palpitations   . PVC's (premature ventricular contractions)   . Abnormal Pap smear HX OF LEEP   . No pertinent past medical history   . Vaginal delivery 05/28/2011   Past Surgical History  Procedure Laterality Date  . No past surgeries     Family History: family history includes Diabetes in her father; Heart disease in her mother. Social History:  reports that she has quit smoking. She has never used smokeless tobacco. She reports that she drinks alcohol. She reports that she does not use illicit drugs.   Prenatal Transfer Tool  Maternal Diabetes: No Genetic Screening: Normal Maternal Ultrasounds/Referrals: Normal Fetal Ultrasounds or other Referrals:  None Maternal Substance Abuse:  No Significant Maternal Medications:  None Significant Maternal Lab Results:  None Other Comments:  None  ROS  Dilation: 5 Effacement (%): 90 Station: -1 Exam by:: [employer] RN Blood pressure 124/81, temperature 97.5 F (36.4 C), temperature source Oral, resp. rate 24, height 5\' 1"  (1.549 m), weight 70.761 kg (156 lb), SpO2 99 %. Maternal Exam:  Uterine Assessment: Contraction strength is firm.  Contraction frequency is regular.   Abdomen: Patient reports no abdominal tenderness. Fundal height is c/ dates.   Fetal presentation: vertex  Introitus: Amniotic fluid character: clear.  Pelvis: adequate for delivery.   Cervix: complete  Fetal Exam Fetal State  Assessment: Category I - tracings are normal.     Physical Exam  Prenatal labs: ABO, Rh: O/Positive/-- (05/05 0000) Antibody: Negative (05/05 0000) Rubella: Immune (05/05 0000) RPR: Nonreactive (05/05 0000)  HBsAg: Negative (05/05 0000)  HIV: Non-reactive (05/05 0000)  GBS: Negative (11/10 0000)   Assessment/Plan: IUP at term with SOL Routine labor and delivery   Taegan Standage S 08/12/2014, 7:15 AM

## 2014-08-12 NOTE — Plan of Care (Signed)
Problem: Phase I Progression Outcomes Goal: Pain controlled with appropriate interventions Outcome: Completed/Met Date Met:  08/12/14 Goal: Voiding adequately Outcome: Completed/Met Date Met:  08/12/14 Goal: OOB as tolerated unless otherwise ordered Outcome: Completed/Met Date Met:  08/12/14 Goal: IS, TCDB as ordered Outcome: Not Applicable Date Met:  08/12/14 Goal: VS, stable, temp < 100.4 degrees F Outcome: Completed/Met Date Met:  08/12/14 Goal: Initial discharge plan identified Outcome: Not Applicable Date Met:  08/12/14     

## 2014-08-12 NOTE — Plan of Care (Signed)
Problem: Phase II Progression Outcomes Goal: Initiate breastfeeding within 1hr delivery Discussed importance of breastfeeding on demand even when milk "is not in", to stimulate milk production.

## 2014-08-12 NOTE — Anesthesia Procedure Notes (Signed)
Epidural Patient location during procedure: OB Start time: 08/12/2014 6:48 AM End time: 08/12/2014 6:52 AM  Staffing Anesthesiologist: Leilani Able Performed by: anesthesiologist   Preanesthetic Checklist Completed: patient identified, surgical consent, pre-op evaluation, timeout performed, IV checked, risks and benefits discussed and monitors and equipment checked  Epidural Patient position: sitting Prep: site prepped and draped and DuraPrep Patient monitoring: continuous pulse ox and blood pressure Approach: midline Location: L3-L4 Injection technique: LOR air  Needle:  Needle type: Tuohy  Needle gauge: 17 G Needle length: 9 cm and 9 Needle insertion depth: 5 cm cm Catheter type: closed end flexible Catheter size: 19 Gauge Catheter at skin depth: 10 cm Test dose: negative and Other  Assessment Sensory level: T9 Events: blood not aspirated, injection not painful, no injection resistance, negative IV test and no paresthesia  Additional Notes Reason for block:procedure for pain

## 2014-08-12 NOTE — Plan of Care (Signed)
Problem: Phase II Progression Outcomes Goal: Pain controlled on oral analgesia Outcome: Completed/Met Date Met:  08/12/14     

## 2014-08-12 NOTE — Anesthesia Preprocedure Evaluation (Signed)
Anesthesia Evaluation  Patient identified by MRN, date of birth, ID band Patient awake    Reviewed: Allergy & Precautions, H&P , NPO status , Patient's Chart, lab work & pertinent test results  Airway Mallampati: I TM Distance: >3 FB Neck ROM: full    Dental no notable dental hx.    Pulmonary former smoker,    Pulmonary exam normal       Cardiovascular negative cardio ROS      Neuro/Psych negative neurological ROS  negative psych ROS   GI/Hepatic negative GI ROS, Neg liver ROS,   Endo/Other  negative endocrine ROS  Renal/GU negative Renal ROS     Musculoskeletal   Abdominal Normal abdominal exam  (+)   Peds  Hematology negative hematology ROS (+)   Anesthesia Other Findings   Reproductive/Obstetrics (+) Pregnancy                           Anesthesia Physical Anesthesia Plan  ASA: II  Anesthesia Plan: Epidural   Post-op Pain Management:    Induction:   Airway Management Planned:   Additional Equipment:   Intra-op Plan:   Post-operative Plan:   Informed Consent: I have reviewed the patients History and Physical, chart, labs and discussed the procedure including the risks, benefits and alternatives for the proposed anesthesia with the patient or authorized representative who has indicated his/her understanding and acceptance.     Plan Discussed with:   Anesthesia Plan Comments:         Anesthesia Quick Evaluation  

## 2014-08-13 LAB — CBC
HCT: 31.5 % — ABNORMAL LOW (ref 36.0–46.0)
Hemoglobin: 10.7 g/dL — ABNORMAL LOW (ref 12.0–15.0)
MCH: 29.1 pg (ref 26.0–34.0)
MCHC: 34 g/dL (ref 30.0–36.0)
MCV: 85.6 fL (ref 78.0–100.0)
Platelets: 173 10*3/uL (ref 150–400)
RBC: 3.68 MIL/uL — ABNORMAL LOW (ref 3.87–5.11)
RDW: 13.2 % (ref 11.5–15.5)
WBC: 13.5 10*3/uL — ABNORMAL HIGH (ref 4.0–10.5)

## 2014-08-13 MED ORDER — IBUPROFEN 600 MG PO TABS: 600.0000 mg | ORAL_TABLET | Freq: Four times a day (QID) | ORAL | Status: DC

## 2014-08-13 MED ORDER — OXYCODONE-ACETAMINOPHEN 5-325 MG PO TABS: 1.0000 | ORAL_TABLET | ORAL | Status: DC | PRN

## 2014-08-13 NOTE — Progress Notes (Signed)
Post Partum Day 1 Subjective: no complaints, up ad lib, voiding, tolerating PO and + flatus  Objective: Blood pressure 114/80, pulse 99, temperature 98.2 F (36.8 C), temperature source Oral, resp. rate 20, height 5\' 1"  (1.549 m), weight 156 lb (70.761 kg), SpO2 80 %, unknown if currently breastfeeding.  Physical Exam:  General: alert and cooperative Lochia: appropriate Uterine Fundus: firm Incision: healing well DVT Evaluation: No evidence of DVT seen on physical exam. Negative Homan's sign. No cords or calf tenderness. No significant calf/ankle edema.   Recent Labs  08/12/14 0605 08/13/14 0605  HGB 12.1 10.7*  HCT 34.6* 31.5*    Assessment/Plan: Discharge home   LOS: 1 day   Margaux Engen G 08/13/2014, 7:58 AM

## 2014-08-13 NOTE — Discharge Summary (Signed)
Obstetric Discharge Summary Reason for Admission: onset of labor Prenatal Procedures: ultrasound Intrapartum Procedures: spontaneous vaginal delivery Postpartum Procedures: none Complications-Operative and Postpartum: 2 degree perineal laceration HEMOGLOBIN  Date Value Ref Range Status  08/13/2014 10.7* 12.0 - 15.0 g/dL Final   HCT  Date Value Ref Range Status  08/13/2014 31.5* 36.0 - 46.0 % Final    Physical Exam:  General: alert and cooperative Lochia: appropriate Uterine Fundus: firm Incision: healing well DVT Evaluation: No evidence of DVT seen on physical exam. Negative Homan's sign. No cords or calf tenderness. No significant calf/ankle edema.  Discharge Diagnoses: Term Pregnancy-delivered  Discharge Information: Date: 08/13/2014 Activity: pelvic rest Diet: routine Medications: PNV, Ibuprofen and Percocet Condition: stable Instructions: refer to practice specific booklet Discharge to: home   Newborn Data: Live born female  Birth Weight: 6 lb 12.5 oz (3075 g) APGAR: 9, 9  Home with mother.  Marguarite Markov G 08/13/2014, 8:03 AM

## 2014-08-13 NOTE — Addendum Note (Signed)
Addendum  created 08/13/14 0916 by Turner Daniels, CRNA   Modules edited: Charges VN, Notes Section   Notes Section:  File: [id number]

## 2014-08-13 NOTE — Anesthesia Postprocedure Evaluation (Signed)
  Anesthesia Post-op Note  Anesthesia Post Note  Patient: Erica Carpenter  Procedure(s) Performed: * No procedures listed *  Anesthesia type: Epidural  Patient location: Mother/Baby  Post pain: Pain level controlled  Post assessment: Post-op Vital signs reviewed  Last Vitals:  Filed Vitals:   08/13/14 0607  BP: 114/80  Pulse: 99  Temp: 36.8 C  Resp: 20    Post vital signs: Reviewed  Level of consciousness:alert  Complications: No apparent anesthesia complications

## 2014-08-13 NOTE — Lactation Note (Signed)
This note was copied from the chart of Erica Elizbeth Gauthier. Lactation Consultation Note Mother reports that BF is going well.  She had latching and supply issues with her 36 year old but feels that this baby is feeding much better.  Discussed how to support her supply with a breast pump.  Hand expression taught and mom was able to return demonstration.  Aware of out patient services. Patient Name: Erica Carpenter FYBOF'B Date: 08/13/2014     Maternal Data    Feeding Feeding Type: Breast Fed Length of feed: 20 min  LATCH Score/Interventions                      Lactation Tools Discussed/Used     Consult Status      Soyla Dryer 08/13/2014, 12:56 PM

## 2014-08-13 NOTE — Plan of Care (Signed)
Problem: Discharge Progression Outcomes Goal: Discharge plan in place and appropriate Outcome: Completed/Met Date Met:  08/13/14     

## 2014-08-13 NOTE — Plan of Care (Signed)
Problem: Consults Goal: Postpartum Patient Education (See Patient Education module for education specifics.)  Outcome: Completed/Met Date Met:  08/13/14  Problem: Discharge Progression Outcomes Goal: Activity appropriate for discharge plan Outcome: Completed/Met Date Met:  08/13/14 Goal: Afebrile, VS remain stable at discharge Outcome: Completed/Met Date Met:  08/13/14

## 2014-08-13 NOTE — Plan of Care (Signed)
Problem: Consults Goal: Postpartum Patient Education (See Patient Education module for education specifics.)  Outcome: Progressing  Problem: Phase II Progression Outcomes Goal: Progress activity as tolerated unless otherwise ordered Outcome: Completed/Met Date Met:  08/13/14 Goal: Afebrile, VS remain stable Outcome: Completed/Met Date Met:  08/13/14 Goal: Incision intact & without signs/symptoms of infection Outcome: Not Applicable Date Met:  85/27/78 Goal: Rh isoimmunization per orders Outcome: Not Applicable Date Met:  24/23/53 Goal: Tolerating diet Outcome: Completed/Met Date Met:  08/13/14  Problem: Discharge Progression Outcomes Goal: Barriers To Progression Addressed/Resolved Outcome: Not Applicable Date Met:  61/44/31 Goal: Tolerating diet Outcome: Completed/Met Date Met:  54/00/86 Goal: Complications resolved/controlled Outcome: Not Applicable Date Met:  76/19/50 Goal: Pain controlled with appropriate interventions Outcome: Completed/Met Date Met:  08/13/14

## 2015-06-09 ENCOUNTER — Ambulatory Visit (INDEPENDENT_AMBULATORY_CARE_PROVIDER_SITE_OTHER): Payer: BLUE CROSS/BLUE SHIELD | Admitting: Internal Medicine

## 2015-06-09 ENCOUNTER — Encounter: Payer: Self-pay | Admitting: Internal Medicine

## 2015-06-09 VITALS — BP 110/82 | HR 77 | Ht 61.0 in | Wt 132.5 lb

## 2015-06-09 DIAGNOSIS — R002 Palpitations: Secondary | ICD-10-CM

## 2015-06-09 DIAGNOSIS — Z8249 Family history of ischemic heart disease and other diseases of the circulatory system: Secondary | ICD-10-CM

## 2015-06-09 NOTE — Patient Instructions (Signed)
Medication Instructions: - no changes  Labwork: - none  Procedures/Testing: - Your physician has requested that you have an echocardiogram. Echocardiography is a painless test that uses sound waves to create images of your heart. It provides your doctor with information about the size and shape of your heart and how well your heart's chambers and valves are working. This procedure takes approximately one hour. There are no restrictions for this procedure.  Follow-Up: - Your physician wants you to follow-up in: 2 years with Dr. Klein. You will receive a reminder letter in the mail two months in advance. If you don't receive a letter, please call our office to schedule the follow-up appointment.  Any Additional Special Instructions Will Be Listed Below (If Applicable).   

## 2015-06-09 NOTE — Progress Notes (Signed)
      Patient Care Team: Lyndon Code, MD as PCP - General (Internal Medicine)   HPI  Erica Carpenter is a 39 y.o. female Seen in follow-up for symptomatic PVCs having seen Dr. Leonia Reeves 4/14. Echocardiogram 2012 was normal.  She has no symptoms of exercise tolerance or peripheral edema. She has had an interval delivery of a second healthy girl ([name])   Records and Results Reviewed include the prior records. Included an echocardiogram.  Past Medical History  Diagnosis Date  . Palpitations   . PVC's (premature ventricular contractions)   . Abnormal Pap smear HX OF LEEP   . No pertinent past medical history   . Vaginal delivery 05/28/2011    Past Surgical History  Procedure Laterality Date  . No past surgeries      Current Outpatient Prescriptions  Medication Sig Dispense Refill  . fluticasone (FLONASE) 50 MCG/ACT nasal spray 1 spray by Each Nare route daily.    Marland Kitchen ibuprofen (ADVIL,MOTRIN) 600 MG tablet Take 1 tablet (600 mg total) by mouth every 6 (six) hours. 30 tablet 1  . loratadine (CLARITIN) 10 MG tablet Take 10 mg by mouth.     No current facility-administered medications for this visit.    No Known Allergies    Review of Systems negative except from HPI and PMH  Physical Exam BP 110/82 mmHg  Pulse 77  Ht 5\' 1"  (1.549 m)  Wt 132 lb 8 oz (60.102 kg)  BMI 25.05 kg/m2 Well developed and well nourished in no acute distress HENT normal E scleral and icterus clear Neck Supple JVP flat; carotids brisk and full Clear to ausculation  regular rate and rhythm, no murmurs gallops or rub Soft with active bowel sounds No clubbing cyanosis  Edema Alert and oriented, grossly normal motor and sensory function Skin Warm and Dry  ECG demonstrates sinus rhythm at 77 Intervals 14/07/38 Otherwise normal   Assessment and  Plan  PVCs-quiescent  Family history of cardiomyopathy  There is extensive family history of a cardiomyopathy involving her mother,  maternal aunt and uncle 2. The details are not yet available.  We will repeat the echo to make sure there is no interval development of left ventricular function not withstanding the paucity of symptoms

## 2015-07-06 ENCOUNTER — Other Ambulatory Visit: Payer: Self-pay

## 2015-07-06 ENCOUNTER — Ambulatory Visit (INDEPENDENT_AMBULATORY_CARE_PROVIDER_SITE_OTHER): Payer: BLUE CROSS/BLUE SHIELD

## 2015-07-06 DIAGNOSIS — Z8249 Family history of ischemic heart disease and other diseases of the circulatory system: Secondary | ICD-10-CM | POA: Diagnosis not present

## 2015-07-16 ENCOUNTER — Telehealth: Payer: Self-pay | Admitting: *Deleted

## 2015-07-16 ENCOUNTER — Other Ambulatory Visit: Payer: Self-pay

## 2015-07-16 DIAGNOSIS — I493 Ventricular premature depolarization: Secondary | ICD-10-CM

## 2015-07-16 NOTE — Telephone Encounter (Signed)
Pt calling would like her echo results.  Called [place] to get a hold of nurse who called her but she's out for the rest of the week. Please call patient.

## 2015-07-16 NOTE — Telephone Encounter (Signed)
Reviewed echo results and Dr. Odessa Fleming recommendations. Pt agreeable w/plan and will have 24 hour holter monitor placed 11/14 at 9:30am Will schedule MRI on Monday.

## 2015-07-20 ENCOUNTER — Ambulatory Visit (INDEPENDENT_AMBULATORY_CARE_PROVIDER_SITE_OTHER): Payer: BLUE CROSS/BLUE SHIELD

## 2015-07-20 ENCOUNTER — Other Ambulatory Visit: Payer: Self-pay

## 2015-07-20 DIAGNOSIS — I493 Ventricular premature depolarization: Secondary | ICD-10-CM

## 2015-07-21 ENCOUNTER — Other Ambulatory Visit: Payer: Self-pay

## 2015-07-21 DIAGNOSIS — I493 Ventricular premature depolarization: Secondary | ICD-10-CM

## 2015-07-22 ENCOUNTER — Encounter: Payer: Self-pay | Admitting: Internal Medicine

## 2015-07-28 ENCOUNTER — Encounter: Payer: Self-pay | Admitting: Internal Medicine

## 2015-08-04 ENCOUNTER — Telehealth: Payer: Self-pay | Admitting: Internal Medicine

## 2015-08-04 NOTE — Telephone Encounter (Signed)
Please call to discuss holter monitor results and why the mri is scheduled at [facility] and not [facility]

## 2015-08-05 NOTE — Telephone Encounter (Signed)
Erica Carpenter  Shon Baton, RN            Yes she was called as the person I scheduled the MRI at [facility] was new and did not know that they don't do MRI's there. So we had to reschedule it for [name]    S/w pt regarding cardiac MRI which will be at [facility] 12/14 at noon. Pt verbalized understanding and is awaiting holter monitor results

## 2015-08-05 NOTE — Telephone Encounter (Signed)
S/w pt who is inquiring as to whether she can have cardiac MRI at [facility]. States she was called today by someone and told it is scheduled at  [facility]. When she inquired, the person couldn't tell her anything except it is at [facility].  She was sent letter stating 12/12, 1:45pm at [facility].  Scheduled by [facility] in [place]. Staff message sent to clarify details. Awaiting response and will call pt back. Pt agreeable w/plan

## 2015-08-06 ENCOUNTER — Ambulatory Visit (HOSPITAL_COMMUNITY): Payer: BLUE CROSS/BLUE SHIELD

## 2015-08-17 ENCOUNTER — Ambulatory Visit: Payer: BLUE CROSS/BLUE SHIELD

## 2015-08-19 ENCOUNTER — Ambulatory Visit (HOSPITAL_COMMUNITY)
Admission: RE | Admit: 2015-08-19 | Discharge: 2015-08-19 | Disposition: A | Discharge status: Home / Self Care | Payer: BLUE CROSS/BLUE SHIELD | Source: Ambulatory Visit | Attending: Internal Medicine | Admitting: Internal Medicine

## 2015-08-19 DIAGNOSIS — I071 Rheumatic tricuspid insufficiency: Secondary | ICD-10-CM | POA: Diagnosis not present

## 2015-08-19 DIAGNOSIS — I493 Ventricular premature depolarization: Secondary | ICD-10-CM

## 2015-08-19 DIAGNOSIS — I517 Cardiomegaly: Secondary | ICD-10-CM | POA: Insufficient documentation

## 2015-08-19 LAB — CREATININE, SERUM
CREATININE: 0.73 mg/dL (ref 0.44–1.00)
GFR calc Af Amer: >60 mL/min (ref >60)

## 2015-08-19 MED ORDER — GADOBENATE DIMEGLUMINE 529 MG/ML IV SOLN
20.0000 mL | Freq: Once | INTRAVENOUS | Status: AC | PRN
  Administered 2015-08-19: 20 mL via INTRAVENOUS

## 2015-09-09 NOTE — Progress Notes (Signed)
Do you mind calling her so the echo can be arrange in [place]?

## 2015-09-11 ENCOUNTER — Other Ambulatory Visit: Payer: Self-pay

## 2015-09-11 DIAGNOSIS — I428 Other cardiomyopathies: Secondary | ICD-10-CM

## 2015-09-16 ENCOUNTER — Other Ambulatory Visit: Payer: BLUE CROSS/BLUE SHIELD

## 2015-10-21 ENCOUNTER — Other Ambulatory Visit: Payer: Self-pay | Admitting: Obstetrics and Gynecology

## 2015-10-21 DIAGNOSIS — R928 Other abnormal and inconclusive findings on diagnostic imaging of breast: Secondary | ICD-10-CM

## 2015-10-28 ENCOUNTER — Ambulatory Visit
Admission: RE | Admit: 2015-10-28 | Discharge: 2015-10-28 | Disposition: A | Discharge status: Home / Self Care | Payer: BLUE CROSS/BLUE SHIELD | Source: Ambulatory Visit | Attending: Obstetrics and Gynecology | Admitting: Obstetrics and Gynecology

## 2015-10-28 DIAGNOSIS — R928 Other abnormal and inconclusive findings on diagnostic imaging of breast: Secondary | ICD-10-CM

## 2015-10-29 ENCOUNTER — Ambulatory Visit (INDEPENDENT_AMBULATORY_CARE_PROVIDER_SITE_OTHER): Payer: BLUE CROSS/BLUE SHIELD | Admitting: Internal Medicine

## 2015-10-29 ENCOUNTER — Encounter: Payer: Self-pay | Admitting: Internal Medicine

## 2015-10-29 VITALS — BP 100/80 | HR 70 | Ht 61.0 in | Wt 130.4 lb

## 2015-10-29 DIAGNOSIS — Z8249 Family history of ischemic heart disease and other diseases of the circulatory system: Secondary | ICD-10-CM | POA: Diagnosis not present

## 2015-10-29 DIAGNOSIS — I493 Ventricular premature depolarization: Secondary | ICD-10-CM | POA: Diagnosis not present

## 2015-10-29 NOTE — Progress Notes (Signed)
Patient Care Team: Lyndon Code, MD as PCP - General (Internal Medicine)   HPI  Erica Carpenter is a 40 y.o. female Seen in follow-up for symptomatic PVCs having seen Dr. Leonia Reeves 4/14. Echocardiogram 2012 was normal.  She has no symptoms of exercise tolerance or peripheral edema. She has had an interval delivery of a second healthy girl ([name]) follow-up echocardiogram 10/16 demonstrated mild depression of LV systolic function with an EF of 45-50%.  Holter monitor had demonstrated one PVC (wow! )  MRI had demonstrated EF of 53% with mild hypokinesis with some degree of increased trabeculation and a ratio of > 2.3:1   She denies palpitations apart from the very brief things, exercise intolerance lightheadedness syncope or dyspnea on exertion.  Records and Results Reviewed include the prior records. Included an echocardiogram and MRI and mothers medical history  Her mother's diagnosis is nonischemic cardiomyopathy. We don't have information regarding her mother's siblings ([place]). Her brother has an LVAD. He and the sisters are followed at [facility]  Past Medical History  Diagnosis Date  . Palpitations   . PVC's (premature ventricular contractions)   . Abnormal Pap smear HX OF LEEP   . No pertinent past medical history   . Vaginal delivery 05/28/2011    Past Surgical History  Procedure Laterality Date  . No past surgeries      Current Outpatient Prescriptions  Medication Sig Dispense Refill  . fluticasone (FLONASE) 50 MCG/ACT nasal spray 1 spray by Each Nare route daily.    Marland Kitchen ibuprofen (ADVIL,MOTRIN) 600 MG tablet Take 1 tablet (600 mg total) by mouth every 6 (six) hours. 30 tablet 1  . loratadine (CLARITIN) 10 MG tablet Take 10 mg by mouth daily.      No current facility-administered medications for this visit.    No Known Allergies    Review of Systems negative except from HPI and PMH  Physical Exam BP 100/80 mmHg  Pulse 70  Ht  (1.549 m)  Wt 130 lb  6.4 oz (59.149 kg)  BMI 24.65 kg/m2 Well developed and well nourished in no acute distress HENT normal E scleral and icterus clear Neck Supple JVP flat; carotids brisk and full Clear to ausculation  regular rate and rhythm, no murmurs gallops or rub Soft with active bowel sounds No clubbing cyanosis  Edema Alert and oriented, grossly normal motor and sensory function Skin Warm and Dry      Assessment and  Plan  PVCs-quiescent  Family history of cardiomyopathy  There is extensive family history of a cardiomyopathy involving her mother, maternal aunt and uncle 2. The details are not yet available.  A maternal aunt is currently being seen at [facility] and as part of a genetic trial under being done at [facility]. I've tried to be in touch with her cardiologist at [facility] Dr Elza Rafter   I have reached out to the cardiologist at [facility]. The issues are going to be identifying the genetic abnormality in this family in which 4 out of 5 siblings are afflicted with cardiomyopathy and then to determine whether this Riddles's cardiomyopathy process is genetically related.  She has mild left ventricular dysfunction. Recommendations for LVEF of less than 50% begins to become preclusive of exercise. 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 We have reviewed other symptom issues  i.e. arrhythmia, syncope, transient neurological events and heart failure symptoms.   We will plan to follow her LVEF by MRI for accuracy sake.  More than 50% of 45 min was spent in counseling related to the above

## 2015-10-29 NOTE — Patient Instructions (Signed)
Medication Instructions: - Your physician recommends that you continue on your current medications as directed. Please refer to the Current Medication list given to you today.  Labwork: - none  Procedures/Testing: - Your physician has requested that you have a cardiac MRI- in 1 year (just prior to seeing Dr. Graciela Husbands back). Cardiac MRI uses a computer to create images of your heart as its beating, producing both still and moving pictures of your heart and major blood vessels. For further information please visit [email or web address]. Please follow the instruction sheet given to you today for more information.  Follow-Up: - Your physician wants you to follow-up in: 1 year with Dr. Graciela Husbands. You will receive a reminder letter in the mail two months in advance. If you don't receive a letter, please call our office to schedule the follow-up appointment.  Any Additional Special Instructions Will Be Listed Below (If Applicable).     If you need a refill on your cardiac medications before your next appointment, please call your pharmacy.

## 2016-10-06 ENCOUNTER — Telehealth: Payer: Self-pay | Admitting: Internal Medicine

## 2016-10-06 DIAGNOSIS — I519 Heart disease, unspecified: Secondary | ICD-10-CM

## 2016-10-06 NOTE — Telephone Encounter (Signed)
I left a message on the patient's identified voice mail that she is coming due to follow up with Dr. Graciela Husbands, but he wanted the cardiac MRI repeated prior to that.  I advised on the message I will place the order for the Cardiac MRI and scheduling will be in touch with her to arrange this. Once we know the date of the Cardiac MRI, we can schedule follow up with Dr. Graciela Husbands.

## 2016-10-07 ENCOUNTER — Encounter: Payer: Self-pay | Admitting: Internal Medicine

## 2016-10-07 ENCOUNTER — Telehealth: Payer: Self-pay | Admitting: Internal Medicine

## 2016-10-07 NOTE — Telephone Encounter (Signed)
Patient called wanting to know how much the cardiac MRI would cost and would insurance cover it.  She was also wondering why she needed another MRI when she had one not long ago.  She will contact Sherri Rad and see if she needs to have the test.  If she does need the test she would like to have the test about 10 a.m.

## 2016-10-07 NOTE — Telephone Encounter (Signed)
Called the patient asking for days and times she would like her cardiac MRI scheduled.  Left my name and phone.

## 2016-10-18 DIAGNOSIS — Z01419 Encounter for gynecological examination (general) (routine) without abnormal findings: Secondary | ICD-10-CM | POA: Diagnosis not present

## 2016-10-18 DIAGNOSIS — Z6825 Body mass index (BMI) 25.0-25.9, adult: Secondary | ICD-10-CM | POA: Diagnosis not present

## 2016-10-21 ENCOUNTER — Ambulatory Visit (HOSPITAL_COMMUNITY): Admission: RE | Admit: 2016-10-21 | Payer: BLUE CROSS/BLUE SHIELD | Source: Ambulatory Visit

## 2016-10-26 ENCOUNTER — Ambulatory Visit (HOSPITAL_COMMUNITY)
Admission: RE | Admit: 2016-10-26 | Discharge: 2016-10-26 | Disposition: A | Discharge status: Home / Self Care | Payer: BLUE CROSS/BLUE SHIELD | Source: Ambulatory Visit | Attending: Internal Medicine | Admitting: Internal Medicine

## 2016-10-26 DIAGNOSIS — I519 Heart disease, unspecified: Secondary | ICD-10-CM

## 2016-10-26 DIAGNOSIS — I429 Cardiomyopathy, unspecified: Secondary | ICD-10-CM | POA: Diagnosis not present

## 2016-10-26 LAB — CREATININE, SERUM
CREATININE: 0.74 mg/dL (ref 0.44–1.00)
GFR calc Af Amer: >60 mL/min (ref >60)
GFR calc non Af Amer: >60 mL/min (ref >60)

## 2016-10-26 MED ORDER — GADOBENATE DIMEGLUMINE 529 MG/ML IV SOLN
23.0000 mL | Freq: Once | INTRAVENOUS | Status: AC
  Administered 2016-10-26: 23 mL via INTRAVENOUS

## 2016-11-10 ENCOUNTER — Ambulatory Visit (INDEPENDENT_AMBULATORY_CARE_PROVIDER_SITE_OTHER): Payer: BLUE CROSS/BLUE SHIELD | Admitting: Internal Medicine

## 2016-11-10 ENCOUNTER — Encounter: Payer: Self-pay | Admitting: Internal Medicine

## 2016-11-10 VITALS — BP 110/80 | HR 61 | Ht 61.0 in | Wt 133.0 lb

## 2016-11-10 DIAGNOSIS — Z8249 Family history of ischemic heart disease and other diseases of the circulatory system: Secondary | ICD-10-CM | POA: Diagnosis not present

## 2016-11-10 DIAGNOSIS — I493 Ventricular premature depolarization: Secondary | ICD-10-CM | POA: Diagnosis not present

## 2016-11-10 NOTE — Patient Instructions (Addendum)

## 2016-11-10 NOTE — Progress Notes (Signed)
Patient Care Team: Lyndon Code, MD as PCP - General (Internal Medicine)   HPI  Erica Carpenter is a 41 y.o. female Seen in follow-up for symptomatic PVCs having seen Dr. Leonia Reeves 4/14. Echocardiogram 2012 was normal.  She has no symptoms of exercise tolerance or peripheral edema. She has had an interval delivery of a second healthy girl ([name]) follow-up echocardiogram 10/16 demonstrated mild depression of LV systolic function with an EF of 45-50%.  Holter monitor had demonstrated one PVC (wow! )  MRI had demonstrated EF of 53% with mild hypokinesis with some degree of increased trabeculation and a ratio of > 2.3:1   DATE TEST    12/16    MRI   EF 53 % Ratio 2.3:1//concerning for noncompaction   2/18    MRI   EF 52 % RATIO 2.3:1  Suspected noncompaction          She denies palpitations; she does have very brief episodes of  Lightheadedness        Her mother's diagnosis is nonischemic cardiomyopathy. We don't have information regarding her mother's siblings ([place]). Her brother has an LVAD. He and the sisters are followed at [facility]  Past Medical History:  Diagnosis Date  . Abnormal Pap smear HX OF LEEP   . No pertinent past medical history   . Palpitations   . PVC's (premature ventricular contractions)   . Vaginal delivery 05/28/2011    Past Surgical History:  Procedure Laterality Date  . NO PAST SURGERIES      Current Outpatient Prescriptions  Medication Sig Dispense Refill  . fluticasone (FLONASE) 50 MCG/ACT nasal spray 1 spray by Each Nare route daily prn.    . ibuprofen (ADVIL,MOTRIN) 600 MG tablet Take 1 tablet (600 mg total) by mouth every 6 (six) hours. (Patient taking differently: Take 600 mg by mouth every 6 (six) hours as needed. ) 30 tablet 1  . loratadine (CLARITIN) 10 MG tablet Take 10 mg by mouth daily as needed.      No current facility-administered medications for this visit.     No Known Allergies    Review of Systems negative except  from HPI and PMH  Physical Exam BP 110/80 (BP Location: Left Arm, Patient Position: Sitting, Cuff Size: Normal)   Pulse 61   Ht 5\' 1"  (1.549 m)   Wt 133 lb (60.3 kg)   BMI 25.13 kg/m  Well developed and well nourished in no acute distress HENT normal E scleral and icterus clear Neck Supple JVP flat; carotids brisk and full Clear to ausculation  regular rate and rhythm, no murmurs gallops or rub Soft with active bowel sounds No clubbing cyanosis  Edema Alert and oriented, grossly normal motor and sensory function Skin Warm and Dry   ECG demonstrates sinus rhythm at 61 Intervals 14/07/40 point   Assessment and  Plan  PVCs-quiescent  Family history of cardiomyopathy  Presyncope  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 His Huskey will recheck to her aunt once again.   Her LV function remains mildly depressed. It is quite stable by MRI Her blood pressure is a little bit on the low side. Will hold off for right now on using Ace inhibitors.  We discussed at some length these episodes of lightheadedness and that potentially could be related to arrhythmia as a consequence of her cardiomyopathy. She will use her FitBit monitor to see if she can identify changes in pulse  and will track the frequency.  We spent more than 50% of our >25 min visit in face to face counseling regarding the above

## 2016-12-30 DIAGNOSIS — Z4420 Encounter for fitting and adjustment of artificial eye, unspecified: Secondary | ICD-10-CM | POA: Diagnosis not present

## 2017-01-20 DIAGNOSIS — Q111 Other anophthalmos: Secondary | ICD-10-CM | POA: Diagnosis not present

## 2017-06-30 DIAGNOSIS — Z4420 Encounter for fitting and adjustment of artificial eye, unspecified: Secondary | ICD-10-CM | POA: Diagnosis not present

## 2017-07-20 DIAGNOSIS — D2339 Other benign neoplasm of skin of other parts of face: Secondary | ICD-10-CM | POA: Diagnosis not present

## 2017-10-20 DIAGNOSIS — Z6826 Body mass index (BMI) 26.0-26.9, adult: Secondary | ICD-10-CM | POA: Diagnosis not present

## 2017-10-20 DIAGNOSIS — Z01419 Encounter for gynecological examination (general) (routine) without abnormal findings: Secondary | ICD-10-CM | POA: Diagnosis not present

## 2017-11-07 DIAGNOSIS — Z13220 Encounter for screening for lipoid disorders: Secondary | ICD-10-CM | POA: Diagnosis not present

## 2017-11-07 DIAGNOSIS — Z131 Encounter for screening for diabetes mellitus: Secondary | ICD-10-CM | POA: Diagnosis not present

## 2017-11-07 DIAGNOSIS — Z1329 Encounter for screening for other suspected endocrine disorder: Secondary | ICD-10-CM | POA: Diagnosis not present

## 2017-11-07 DIAGNOSIS — Z13228 Encounter for screening for other metabolic disorders: Secondary | ICD-10-CM | POA: Diagnosis not present

## 2017-12-06 ENCOUNTER — Encounter: Payer: Self-pay | Admitting: Podiatry

## 2018-01-09 DIAGNOSIS — Z4420 Encounter for fitting and adjustment of artificial eye, unspecified: Secondary | ICD-10-CM | POA: Diagnosis not present

## 2018-04-26 ENCOUNTER — Ambulatory Visit (INDEPENDENT_AMBULATORY_CARE_PROVIDER_SITE_OTHER): Payer: BLUE CROSS/BLUE SHIELD | Admitting: Internal Medicine

## 2018-04-26 ENCOUNTER — Encounter: Payer: Self-pay | Admitting: Internal Medicine

## 2018-04-26 VITALS — BP 132/83 | HR 65 | Ht 61.0 in | Wt 143.0 lb

## 2018-04-26 DIAGNOSIS — I493 Ventricular premature depolarization: Secondary | ICD-10-CM

## 2018-04-26 NOTE — Patient Instructions (Signed)
Medication Instructions: - Your physician recommends that you continue on your current medications as directed. Please refer to the Current Medication list given to you today.  Labwork: - none ordered  Procedures/Testing: - none ordered  Follow-Up: - Your physician wants you to follow-up in: 1 year with Dr. Graciela Husbands. You will receive a reminder letter in the mail/ call two months in advance. If you don't receive a letter/ call, please call our office to schedule the follow-up appointment.   Any Additional Special Instructions Will Be Listed Below (If Applicable).  - We will be in touch with you after Dr. Graciela Husbands speaks with the physician at [facility].    If you need a refill on your cardiac medications before your next appointment, please call your pharmacy.

## 2018-04-26 NOTE — Progress Notes (Signed)
Patient Care Team: Lyndon Code, MD as PCP - General (Internal Medicine)   HPI  Erica Carpenter is a 42 y.o. female Seen in follow-up for symptomatic PVCs having seen Dr. Leonia Reeves 4/14.  [name]'s daughter   Echocardiogram 2012 was normal.  She has no symptoms of exercise tolerance or peripheral edema. She has two children (Erica Carpenter/Erica Carpenter) follow-up echocardiogram 10/16 demonstrated mild depression of LV systolic function with an EF of 45-50%.  Holter monitor had demonstrated one PVC (wow! )  MRI had demonstrated EF of 53% with mild hypokinesis with some degree of increased trabeculation and a ratio of > 2.3:1   DATE TEST    12/16 MRI   EF 53 % Ratio 2.3:1//concerning for noncompaction   2/18 MRI   EF 52 % RATIO 2.3:1  Suspected noncompaction         Her mother's diagnosis is nonischemic cardiomyopathy. We don't have information regarding her mother's siblings ([place]). Her brother has an LVAD. He and the sisters are followed at [facility]  Her maternal genetic diagnosis of a DSP gene defect.  I will reach out to Dr. Allena Katz who was the mother's doctor and the brother's Dr.  Ms. Erica Carpenter is doing well at this point without symptoms of chest pain shortness of breath.  She had no syncope and no PVCs.  Past Medical History:  Diagnosis Date  . Abnormal Pap smear HX OF LEEP   . No pertinent past medical history   . Palpitations   . PVC's (premature ventricular contractions)   . Vaginal delivery 05/28/2011    Past Surgical History:  Procedure Laterality Date  . NO PAST SURGERIES      Current Outpatient Medications  Medication Sig Dispense Refill  . cetirizine (ZYRTEC) 10 MG tablet Take 10 mg by mouth daily as needed for allergies.    . fluticasone (FLONASE) 50 MCG/ACT nasal spray 1 spray by Each Nare route daily prn.    . ibuprofen (ADVIL,MOTRIN) 600 MG tablet Take 1 tablet (600 mg total) by mouth every 6 (six) hours. (Patient taking differently: Take 600 mg by mouth every 6  (six) hours as needed. ) 30 tablet 1   No current facility-administered medications for this visit.     No Known Allergies    Review of Systems negative except from HPI and PMH  Physical Exam BP 132/83 (BP Location: Left Arm, Patient Position: Sitting, Cuff Size: Normal)   Pulse 65   Ht 5\' 1"  (1.549 m)   Wt 143 lb (64.9 kg)   BMI 27.02 kg/m  Well developed and nourished in no acute distress HENT normal Neck supple with JVP-flat Clear Regular rate and rhythm, no murmurs or gallops Abd-soft with active BS No Clubbing cyanosis edema Skin-warm and dry A & Oriented  Grossly normal sensory and motor function     ECG demonstrates siinus with out T wave abnormality   Assessment and  Plan  PVCs-quiescent  Family history of cardiomyopathy maternal aunt with DSP gene  Presyncope  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  Will refer Erica Carpenter to Dr. Jomarie Longs for genetic counseling and testing.  Reviewed the genetic report on her aunt  Repeat MRI may be useful in clarifying the issue of non-compaction although I would probably be inclined to do the genetic testing first as a non-compaction as you has been equivocal  We spent more than 50% of our >25 min visit in face to face  counseling regarding the above

## 2018-04-27 DIAGNOSIS — Z4420 Encounter for fitting and adjustment of artificial eye, unspecified: Secondary | ICD-10-CM | POA: Diagnosis not present

## 2018-05-09 ENCOUNTER — Telehealth: Payer: Self-pay | Admitting: Internal Medicine

## 2018-05-09 DIAGNOSIS — Z8249 Family history of ischemic heart disease and other diseases of the circulatory system: Secondary | ICD-10-CM

## 2018-05-09 NOTE — Telephone Encounter (Signed)
I left a message for the patient that Dr. Graciela Husbands is still waiting on further feedback from [facility], but he would like to proceed with setting her up to see Dr. Jomarie Longs for genetic counseling.  I advised that a scheduler from our [place] office will be in touch with her to arrange this.

## 2018-06-14 ENCOUNTER — Ambulatory Visit: Payer: BLUE CROSS/BLUE SHIELD | Admitting: Genetic Counselor

## 2018-06-14 ENCOUNTER — Encounter (INDEPENDENT_AMBULATORY_CARE_PROVIDER_SITE_OTHER): Payer: Self-pay

## 2018-06-19 NOTE — Progress Notes (Signed)
Pre Test GC  Referral Reason  Gilma Bessette was referred for genetic testing by Dr. Graciela Husbands. She presents with mild Left ventricular non-compaction and has a significant family history of cardiomyopathy amongst her maternal relatives. Her affected maternal aunt was found to harbor a pathogenic variant in DSP. Sorina would like to be tested for this familial pathogenic variant.   Genetic Consultation Notes  Lyris Hitchman was counseled on the genetics of cardiomyopathies. We discussed the different cardiomyopathies, namely hypertrophic cardiomyopathy(HCM), dilated cardiomyopathy (DCM) and arrhythmogenic cardiomyopathy [facility]) formerly called ARVC, namely its inheritance, variable expression and incomplete penetrance that is often observed in these genetic conditions. I reviewed the genes that are responsible for these conditions, specifically the pathogenic variant in DSP gene that was previously reported in her maternal aunt. We also discussed appropriate genetic follow up of her family members if she was found to have the familial pathogenic variant in DSP. Her medical and 4-generation family history was obtained. 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 She informs me that she was found to have an abnormal heartbeat during her routine annual physical with her Ob-Gyn. She states that she chose to see Dr. Graciela Husbands as he was treating her mother, Legrand Pitts, for non-ischemic cardiomyopathy (NICM) at that time. She reports that her Echocardiogram, EKG and heart monitor were normal. An MRI indicated mild LVNC.  She states that she is mostly asymptomatic with occasional heart flutters and dizziness. She does not have shortness of breath and has not had a syncopal episode. She does mention having PVCs.   Family history Jessenia (III.1) is the eldest of three siblings. Her two younger brothers (III.2, III.3)  ages 2 and 62 are in good health as are her children ages 61 and 8 (IV.1-IV2) and her nephews and nieces (IV.3-IV.6)   There is no history of heart disease in her paternal lineage. She is not in touch with her father and is unaware of his current medical status or his family history.   Jolanta's mother (II.6) was diagnosed with heart disease at 24. She was diagnosed with persistent atrial fibrillation. Her condition progressively worsened and was found to have NICM and later chronic systolic CHF. She had an ICD implanted in her 52s subsequent to suffering from a cardiac arrest at home. Early this year she was admitted at [facility] in cardiogenic shock following a planned TEE. After initially recovering, her condition worsened necessitating VAD placement, However, she suffered a massive GI bleed soon after and passed away soon after at 64.  Carlos states that three of her mother's siblings have symptoms and diagnosis of cardiomyopathy like her mother. Ashleymarie's maternal uncle (II.7) passed away in 06/04/17 from CHF. He had an LVAD and was being treated at [facility]. He has two daughters (III.4-III.5) in their 30s who are currently in good health. His younger brother (II.8) also presented with symptoms similar to Kelvin's mother and eventually had a dual pacemaker-ICD implanted at 79. Their youngest sister (II.10) also has a familial cardiomyopathy and is receiving medical care at [facility]. She too has a dual pacemaker-ICD implanted. Her cardiologist ordered genetic testing that identified the presence of a pathogenic variant in the DSP gene, a gene implicated in Arrhythmogenic cardiomyopathy. Kihanna states that her two daughter s (III.7, III.8) have not yet undergone genetic testing for this pathogenic variant.    Rolonda's maternal grandmother (I.4) is in relatively  good health at 64 and states that her siblings do not have any overt heart disease. Her maternal grandfather (I.3) died in his 40s of colon cancer    Impression  Latay is noted to have mild LVNC in the absence of a co-existing cardiac abnormality. Isolated LVNC is considered a genetic cardiomyopathy and can be associated with Barth syndrome, mitochondrial disorders and myotonic dystrophy, or concomitantly present with hypertrophic and dilated cardiomyopathy. However the major genetic cause for familial noncompaction has yet to be identified. Generally speaking, LVNC may represent a morphologic continuum of genetic myopathies, including hypertrophic and dilated cardiomyopathy.  There is a significant family history of cardiomyopathy in her maternal lineage. Her mother, two maternal uncles (II.7, II.8) and a maternal aunt (II.10) were diagnosed with NICM with her mom and one maternal uncle (II.7) having CHF and eventually passing away. The other two affected relatives (II.8, II.10) had a dual pacemaker ICD implanted.   Her maternal aunt (II.10) underwent genetic testing and was found to be heterozygous for a pathogenic variant in the DSP gene, namely c. 268C>T, p.Gln90Ter. This particular variant has not been reported in literature. However, this mutation will lead to premature truncation of the protein and it is suspected that the cell eventually degrades the defective transcript and/protein. Truncating mutations in DSP have been previously reported as causal to AC/ARVC. This variant is absent amongst Caucasians in large population databases indicating that it is a rare variant. It is highly likely that this variant is causal to her aunt's cardiomyopathy and may also be the underlying cause of heart disease in her maternal relatives.  It is not clear if her mother and her siblings inherited the condition from their father or mother. ARVC/AC has very low penetrance, ~25%-50% and individuals that carry a pathogenic variant may not express the condition. Nevertheless, if the assumption is made that Shirle's mother harbored the pathogenic variant in DSP, her  daughter is now at a 50% risk of AC/ARVC. Considering that she has mild LVNC, it is possible that she may have a benign variant of LVNC or that it may be a continuum of genetic myopathies, like  ARVC.  If she is found to harbor the DSP familial variant, her two daughters are at a 50% risk of inheriting the pathogenic variant. She expressed understanding of this.  We also discussed the protections afforded by the Genetic Information Non-Discrimination Act (GINA). I explained to her that GINA protects her from losing employment or health insurance based on her genotype. However, these protections do not cover life insurance and disability. I recommended that she obtain appropriate life insurance coverage for her children to avoid future denials, if they test positive for the familial variant. She states that she does have life insurance coverage and verbalized understanding of this.  Plan Jenilyn is interested in genetic testing for the familial pathogenic variant in DSP (Q90X). Blood was drawn today and sent out for testing.   Sidney Ace, Ph.D, [facility] Clinical Molecular Geneticist

## 2018-07-05 ENCOUNTER — Telehealth: Payer: Self-pay | Admitting: Genetic Counselor

## 2018-07-05 NOTE — Telephone Encounter (Signed)
Called Patient back. Patient was told that she needed to bring her kids to her next appointment, who are 13 and 40 years of age. Patient was wondering if Dr. Jomarie Longs meant her siblings instead, because she thought her children are too young to be tested at this time. Will forward to Dr. Jomarie Longs for clarification.

## 2018-07-05 NOTE — Telephone Encounter (Signed)
° °  Patient calling to confirm the need to bring her children to appointment.  The children are age 40 and 78. Patient wants to know if they will be having testing done.

## 2018-07-17 NOTE — Telephone Encounter (Signed)
Left CVM per DPR.  Advised that per review of Dr. Edison Simon last note appears Dr. Jomarie Longs was concerned about Pt's daughters and them possibly having the gene.  Advised Pt to bring children with her to appt.  Advised to call if any further questions.

## 2018-07-19 ENCOUNTER — Ambulatory Visit: Payer: BLUE CROSS/BLUE SHIELD | Admitting: Genetic Counselor

## 2018-07-19 ENCOUNTER — Encounter: Payer: BLUE CROSS/BLUE SHIELD | Admitting: Genetic Counselor

## 2018-07-19 DIAGNOSIS — D2339 Other benign neoplasm of skin of other parts of face: Secondary | ICD-10-CM | POA: Diagnosis not present

## 2018-07-20 ENCOUNTER — Telehealth: Payer: BLUE CROSS/BLUE SHIELD | Admitting: Family

## 2018-07-20 DIAGNOSIS — B9689 Other specified bacterial agents as the cause of diseases classified elsewhere: Secondary | ICD-10-CM

## 2018-07-20 DIAGNOSIS — J028 Acute pharyngitis due to other specified organisms: Secondary | ICD-10-CM

## 2018-07-20 MED ORDER — BENZONATATE 100 MG PO CAPS: 100.0000 mg | ORAL_CAPSULE | Freq: Three times a day (TID) | ORAL | 0 refills | Status: DC | PRN

## 2018-07-20 MED ORDER — PREDNISONE 5 MG PO TABS: 5.0000 mg | ORAL_TABLET | ORAL | 0 refills | Status: DC

## 2018-07-20 MED ORDER — AZITHROMYCIN 250 MG PO TABS: ORAL_TABLET | ORAL | 0 refills | Status: DC

## 2018-07-20 NOTE — Progress Notes (Signed)
Thank you for the details you included in the comment boxes. Those details are very helpful in determining the best course of treatment for you and help [place] to provide the best care.Continue the Flonase you have, in addition to plan below.   We are sorry that you are not feeling well.  Here is how we plan to help!  Based on your presentation I believe you most likely have A cough due to bacteria.  When patients have a fever and a productive cough with a change in color or increased sputum production, we are concerned about bacterial bronchitis.  If left untreated it can progress to pneumonia.  If your symptoms do not improve with your treatment plan it is important that you contact your provider.   I have prescribed Azithromyin 250 mg: two tablets now and then one tablet daily for 4 additonal days    In addition you may use A non-prescription cough medication called Mucinex DM: take 2 tablets every 12 hours. and A prescription cough medication called Tessalon Perles 100mg . You may take 1-2 capsules every 8 hours as needed for your cough.  Prednisone 5 mg daily for 6 days (see taper instructions below)  Directions for 6 day taper: Day 1: 2 tablets before breakfast, 1 after both lunch & dinner and 2 at bedtime Day 2: 1 tab before breakfast, 1 after both lunch & dinner and 2 at bedtime Day 3: 1 tab at each meal & 1 at bedtime Day 4: 1 tab at breakfast, 1 at lunch, 1 at bedtime Day 5: 1 tab at breakfast & 1 tab at bedtime Day 6: 1 tab at breakfast   From your responses in the eVisit questionnaire you describe inflammation in the upper respiratory tract which is causing a significant cough.  This is commonly called Bronchitis and has four common causes:    Allergies  Viral Infections  Acid Reflux  Bacterial Infection Allergies, viruses and acid reflux are treated by controlling symptoms or eliminating the cause. An example might be a cough caused by taking certain blood pressure medications.  You stop the cough by changing the medication. Another example might be a cough caused by acid reflux. Controlling the reflux helps control the cough.  USE OF BRONCHODILATOR ("RESCUE") INHALERS: There is a risk from using your bronchodilator too frequently.  The risk is that over-reliance on a medication which only relaxes the muscles surrounding the breathing tubes can reduce the effectiveness of medications prescribed to reduce swelling and congestion of the tubes themselves.  Although you feel brief relief from the bronchodilator inhaler, your asthma may actually be worsening with the tubes becoming more swollen and filled with mucus.  This can delay other crucial treatments, such as oral steroid medications. If you need to use a bronchodilator inhaler daily, several times per day, you should discuss this with your provider.  There are probably better treatments that could be used to keep your asthma under control.     HOME CARE . Only take medications as instructed by your medical team. . Complete the entire course of an antibiotic. . Drink plenty of fluids and get plenty of rest. . Avoid close contacts especially the very young and the elderly . Cover your mouth if you cough or cough into your sleeve. . Always remember to wash your hands . A steam or ultrasonic humidifier can help congestion.   GET HELP RIGHT AWAY IF: . You develop worsening fever. . You become short of breath . You  cough up blood. . Your symptoms persist after you have completed your treatment plan MAKE SURE YOU   Understand these instructions.  Will watch your condition.  Will get help right away if you are not doing well or get worse.  Your e-visit answers were reviewed by a board certified advanced clinical practitioner to complete your personal care plan.  Depending on the condition, your plan could have included both over the counter or prescription medications. If there is a problem please reply  once you have  received a response from your provider. Your safety is important to [place].  If you have drug allergies check your prescription carefully.    You can use MyChart to ask questions about today's visit, request a non-urgent call back, or ask for a work or school excuse for 24 hours related to this e-Visit. If it has been greater than 24 hours you will need to follow up with your provider, or enter a new e-Visit to address those concerns. You will get an e-mail in the next two days asking about your experience.  I hope that your e-visit has been valuable and will speed your recovery. Thank you for using e-visits.

## 2018-07-27 NOTE — Progress Notes (Signed)
Post Test Genetic Consultation  Encounter Date: 07/19/18  Erica Carpenter is here today with her husband and maternal aunt for a post-test genetic consult. Her sister-in-law dialed in to hear our discussion about her test result  I reviewed her family pedigree with Margeret and input from her maternal aunt who was found to have the heterozygous pathogenic variant in DSP gene that is implicated in Arrhythmogenic Cardiomyopathy. I corrected the ages reported previously for her mother's siblings. Briceyda informed me that that one cousin sister, age 40 has now tested positive for the familial variant and her 2 year-old sister is awaiting life insurance before she gets tested.  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 I showed her pictures of the desmosomal proteins and explained to her that his type of mutation leads to a truncated protein that is most likely degraded by the cells surveillance system. I told her that this particular variant has not yet been reported in patients with [facility]. However, another type of mutation, p.Gln90Arg in the same location has been identified and functional in vitro studies in cell lines and transgenic mice indicate a distinct loss of function of this protein. It can be postulated that her familial variant may be as or more severe than the previously reported p.Gln90Arg mutation.   I also explained to her family that this condition has low penetrance, that is individuals that carry a genetic mutation in the desmosomal genes may not present clinically with AC. Based on her family history, this pathogenic variant may have very high penetrance as four of her mother's five siblings have been found to have a familial cardiomyopathy with her mother presenting with heart disease as early as age 26.  Additionally, I enquired if they recollect any family members having wooly hair or very dry  thick skin indicative of keratoderma. Shalisha informs me that her maternal uncles with the cardiomyopathy did have very curly hair, much like an Afro style. Her mother to had curly hair in her youth and began straightening her hair. Her maternal aunt who is here today does not have curly hair. However, she is not aware of any skin issues other than the fact that they had dry skin. 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  I informed them that I would discuss this further with Dr, Caryl Comes since he may be familiar with the clinical presentation of patients with Carvajal syndrome. We discussed the potential genetic testing strategy for her two cousins (III.4, III.5) whose father died of congestive heart failure at 70 (II.7). It would be prudent to test the entire DSP gene in both her cousins to confirm if they harbor an additional DSP variant, other than the familial DSP Q90X variant. She verbalized understanding of this.  She adds that she has several relatives on her maternal grandfather's side of the family with heart disease and reports a cousin (not indicated on pedigree) with a defibrillator in her 79s.  Plan Gaelle will obtain appropriate life insurance for her daughters prior to genetic testing. I recommended that she inform her pediatrician about the familial variant, so she can initiate testing at the appropriate time.   07/26/18- Spoke with patient today to inform her that Dr. Caryl Comes will review her MRI for [facility]. Also informed her that Dr. Caryl Comes would like to schedule her  cousin that tested positive to see him. She informed me that she just found out that her cousin had a defibrillator or ICD placed on Friday, 07/20/18. She states that she is not in close contact with the family and was unaware of this when we met on 07/19/18. She is also unsure if her cousin had any specific heart issues. I  informed her that Dr. Caryl Comes did recommend that the sister reach out to [facility] to see him and, for a genetic consult with me. She verbalized understanding of this.    Lattie Corns, Ph.D, [facility] Clinical Molecular Geneticist

## 2018-08-16 ENCOUNTER — Encounter: Payer: Self-pay | Admitting: Nurse Practitioner

## 2018-08-16 ENCOUNTER — Ambulatory Visit (INDEPENDENT_AMBULATORY_CARE_PROVIDER_SITE_OTHER): Payer: BLUE CROSS/BLUE SHIELD | Admitting: Nurse Practitioner

## 2018-08-16 VITALS — BP 115/89 | HR 99 | Temp 98.0°F | Resp 16 | Ht 61.0 in | Wt 137.8 lb

## 2018-08-16 DIAGNOSIS — J0141 Acute recurrent pansinusitis: Secondary | ICD-10-CM

## 2018-08-16 DIAGNOSIS — R05 Cough: Secondary | ICD-10-CM

## 2018-08-16 DIAGNOSIS — R059 Cough, unspecified: Secondary | ICD-10-CM | POA: Insufficient documentation

## 2018-08-16 DIAGNOSIS — H9203 Otalgia, bilateral: Secondary | ICD-10-CM

## 2018-08-16 MED ORDER — PREDNISONE 5 MG (21) PO TBPK: ORAL_TABLET | ORAL | 0 refills | Status: DC

## 2018-08-16 MED ORDER — CEFUROXIME AXETIL 500 MG PO TABS: 500.0000 mg | ORAL_TABLET | Freq: Two times a day (BID) | ORAL | 0 refills | Status: DC

## 2018-08-16 NOTE — Progress Notes (Signed)
[facility] [address] [place], [place] [address]  Internal MEDICINE  Office Visit Note  Patient Name: Erica Carpenter  [id number]  [id number]  Date of Service: 08/16/2018  Chief Complaint  Patient presents with  . Cough    been going on and off since halloween, no fever or chills, pt has taken antibiotics and zpack felt better when taking it but symptoms came back  . Nasal Congestion  . Pain    right ear pain that started last week  . Sinusitis    pt treated with antibiotic since halloween     The patient has had cough, congestion, sinus headache, and post nasal drip, which has been going on since around Halloween. Did have E visit with provider. Was put on z-pack and prednisone taper for 6 days. Did feel better for a short time, but symptoms relapsed very quickly.   Pt is here for a sick visit.     Current Medication:  Outpatient Encounter Medications as of 08/16/2018  Medication Sig Note  . cetirizine (ZYRTEC) 10 MG tablet Take 10 mg by mouth daily as needed for allergies.   . fluticasone (FLONASE) 50 MCG/ACT nasal spray 1 spray by Each Nare route daily prn. 06/09/2015: Received from: [facility]  . ibuprofen (ADVIL,MOTRIN) 600 MG tablet Take 1 tablet (600 mg total) by mouth every 6 (six) hours. (Patient taking differently: Take 600 mg by mouth every 6 (six) hours as needed. )   . azithromycin (ZITHROMAX) 250 MG tablet Take 2 tabs now then 1 daily times 4 days (Patient not taking: Reported on 08/16/2018)   . benzonatate (TESSALON PERLES) 100 MG capsule Take 1-2 capsules (100-200 mg total) by mouth every 8 (eight) hours as needed for cough. (Patient not taking: Reported on 08/16/2018)   . cefUROXime (CEFTIN) 500 MG tablet Take 1 tablet (500 mg total) by mouth 2 (two) times daily with a meal.   . predniSONE (STERAPRED UNI-PAK 21 TAB) 5 MG (21) TBPK tablet Take by mouth as directed for 6 days   . [DISCONTINUED] predniSONE (DELTASONE) 5 MG tablet Take 1 tablet  (5 mg total) by mouth as directed. (Patient not taking: Reported on 08/16/2018)    No facility-administered encounter medications on file as of 08/16/2018.       Medical History: Past Medical History:  Diagnosis Date  . Abnormal Pap smear HX OF LEEP   . No pertinent past medical history   . Palpitations   . PVC's (premature ventricular contractions)   . Vaginal delivery 05/28/2011    Today's Vitals   08/16/18 0918  BP: 115/89  Pulse: 99  Resp: 16  Temp: 98 F (36.7 C)  SpO2: 97%  Weight: 137 lb 12.8 oz (62.5 kg)  Height: 5\' 1"  (1.549 m)    Review of Systems  Constitutional: Positive for fatigue. Negative for chills and fever.  HENT: Positive for congestion, ear pain, postnasal drip, rhinorrhea and sinus pain. Negative for sore throat and voice change.   Respiratory: Positive for cough. Negative for wheezing.   Cardiovascular: Negative for chest pain and palpitations.  Gastrointestinal: Negative.   Musculoskeletal: Positive for myalgias.  Allergic/Immunologic: Positive for environmental allergies.  Neurological: Positive for headaches.  Hematological: Positive for adenopathy.    Physical Exam Vitals signs and nursing note reviewed.  Constitutional:      General: She is not in acute distress.    Appearance: She is well-developed. She is not diaphoretic.  HENT:     Head: Normocephalic and  atraumatic.     Ears:     Comments: Ear canals erythematous and slightly edematous.     Nose: Congestion and rhinorrhea present.     Comments: Maxillary sinus pressure     Mouth/Throat:     Mouth: Mucous membranes are moist.     Pharynx: No oropharyngeal exudate.  Eyes:     Extraocular Movements: Extraocular movements intact.     Pupils: Pupils are equal, round, and reactive to light.  Neck:     Musculoskeletal: Normal range of motion and neck supple.     Thyroid: No thyromegaly.     Vascular: No JVD.     Trachea: No tracheal deviation.  Cardiovascular:     Rate and  Rhythm: Normal rate and regular rhythm.     Pulses: Normal pulses.     Heart sounds: Normal heart sounds. No murmur. No friction rub. No gallop.   Pulmonary:     Effort: Pulmonary effort is normal. No respiratory distress.     Breath sounds: Normal breath sounds. No wheezing or rales.  Chest:     Chest wall: No tenderness.  Musculoskeletal: Normal range of motion.  Lymphadenopathy:     Cervical: No cervical adenopathy.  Skin:    General: Skin is warm and dry.  Neurological:     General: No focal deficit present.     Mental Status: She is alert and oriented to person, place, and time.     Cranial Nerves: No cranial nerve deficit.  Psychiatric:        Behavior: Behavior normal.        Thought Content: Thought content normal.        Judgment: Judgment normal.   Assessment/Plan: 1. Acute recurrent pansinusitis Start ceftin 500mg  twice daily. Repeat prednisone5mg  taper. Take as directed for 6 days. Continue to use OTC medications to help alleviate symptoms.  - cefUROXime (CEFTIN) 500 MG tablet; Take 1 tablet (500 mg total) by mouth 2 (two) times daily with a meal.  Dispense: 20 tablet; Refill: 0 - predniSONE (STERAPRED UNI-PAK 21 TAB) 5 MG (21) TBPK tablet; Take by mouth as directed for 6 days  Dispense: 21 tablet; Refill: 0  2. Cough May continue to use previously prescribed tessalon perls to help reduce cough.   3. Ear pain, bilateral Recommend OTC earache relief drops to use as needed and as indicated to reduce ear pain.   General Counseling: Jenyfer verbalizes understanding of the findings of todays visit and agrees with plan of treatment. I have discussed any further diagnostic evaluation that may be needed or ordered today. We also reviewed her medications today. she has been encouraged to call the office with any questions or concerns that should arise related to todays visit.    Counseling:  Rest and increase fluids. Continue using OTC medication to control symptoms.   This  patient was seen by Vincent Gros FNP Collaboration with Dr Lyndon Code as a part of collaborative care agreement  Meds ordered this encounter  Medications  . cefUROXime (CEFTIN) 500 MG tablet    Sig: Take 1 tablet (500 mg total) by mouth 2 (two) times daily with a meal.    Dispense:  20 tablet    Refill:  0    Order Specific Question:   Supervising Provider    Answer:   Lyndon Code [[id number]]  . predniSONE (STERAPRED UNI-PAK 21 TAB) 5 MG (21) TBPK tablet    Sig: Take by mouth as directed for 6 days  Dispense:  21 tablet    Refill:  0    Order Specific Question:   Supervising Provider    Answer:   Lyndon Code [[id number]]    Time spent: 25 Minutes

## 2018-10-25 DIAGNOSIS — Z1321 Encounter for screening for nutritional disorder: Secondary | ICD-10-CM | POA: Diagnosis not present

## 2018-10-25 DIAGNOSIS — Z6825 Body mass index (BMI) 25.0-25.9, adult: Secondary | ICD-10-CM | POA: Diagnosis not present

## 2018-10-25 DIAGNOSIS — Z1231 Encounter for screening mammogram for malignant neoplasm of breast: Secondary | ICD-10-CM | POA: Diagnosis not present

## 2018-10-25 DIAGNOSIS — Z13228 Encounter for screening for other metabolic disorders: Secondary | ICD-10-CM | POA: Diagnosis not present

## 2018-10-25 DIAGNOSIS — Z1329 Encounter for screening for other suspected endocrine disorder: Secondary | ICD-10-CM | POA: Diagnosis not present

## 2018-10-25 DIAGNOSIS — Z01419 Encounter for gynecological examination (general) (routine) without abnormal findings: Secondary | ICD-10-CM | POA: Diagnosis not present

## 2018-10-25 DIAGNOSIS — Z131 Encounter for screening for diabetes mellitus: Secondary | ICD-10-CM | POA: Diagnosis not present

## 2018-11-02 DIAGNOSIS — Z4420 Encounter for fitting and adjustment of artificial eye, unspecified: Secondary | ICD-10-CM | POA: Diagnosis not present

## 2019-03-26 ENCOUNTER — Ambulatory Visit: Payer: BC Managed Care – PPO | Admitting: Podiatry

## 2019-04-09 ENCOUNTER — Other Ambulatory Visit: Payer: Self-pay

## 2019-04-09 ENCOUNTER — Ambulatory Visit: Payer: BC Managed Care – PPO

## 2019-04-09 ENCOUNTER — Encounter: Payer: Self-pay | Admitting: Podiatry

## 2019-04-09 ENCOUNTER — Ambulatory Visit (INDEPENDENT_AMBULATORY_CARE_PROVIDER_SITE_OTHER): Payer: BC Managed Care – PPO | Admitting: Podiatry

## 2019-04-09 VITALS — Temp 98.0°F

## 2019-04-09 DIAGNOSIS — M722 Plantar fascial fibromatosis: Secondary | ICD-10-CM

## 2019-04-09 DIAGNOSIS — L989 Disorder of the skin and subcutaneous tissue, unspecified: Secondary | ICD-10-CM

## 2019-04-09 DIAGNOSIS — R234 Changes in skin texture: Secondary | ICD-10-CM | POA: Diagnosis not present

## 2019-04-11 NOTE — Progress Notes (Signed)
   HPI: 43 y.o. female presenting today as a new patient with a chief complaint of bilateral heel pain that began 2-3 years ago. She reports cracking of the skin on the heels that will not heal. Walking increases the pain. She has been using Dr. Lorrin Mais heel cream and silicone heel socks for treatment. Patient is here for further evaluation and treatment.   Past Medical History:  Diagnosis Date  . Abnormal Pap smear HX OF LEEP   . No pertinent past medical history   . Palpitations   . PVC's (premature ventricular contractions)   . Vaginal delivery 05/28/2011     Physical Exam: General: The patient is alert and oriented x3 in no acute distress.  Dermatology: Bilateral heel fissures noted. Skin is warm, dry and supple bilateral lower extremities. Negative for open lesions or macerations.  Vascular: Palpable pedal pulses bilaterally. No edema or erythema noted. Capillary refill within normal limits.  Neurological: Epicritic and protective threshold grossly intact bilaterally.   Musculoskeletal Exam: Range of motion within normal limits to all pedal and ankle joints bilateral. Muscle strength 5/5 in all groups bilateral.   Assessment: 1. Heel fissures bilateral   Plan of Care:  1. Patient evaluated.  2. Trimmed fissures with tissue nipper.  3. Recommended Urea 40% cream daily.  4. Recommended not going barefoot or wearing sandals.  5. Return to clinic in 4 weeks.   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      Edrick Kins, DPM [facility]  Dr. Edrick Kins, DPM    [address],  [address]                Office [phone]  Fax [phone]

## 2019-04-26 DIAGNOSIS — Z4420 Encounter for fitting and adjustment of artificial eye, unspecified: Secondary | ICD-10-CM | POA: Diagnosis not present

## 2019-05-07 ENCOUNTER — Ambulatory Visit (INDEPENDENT_AMBULATORY_CARE_PROVIDER_SITE_OTHER): Payer: BC Managed Care – PPO | Admitting: Podiatry

## 2019-05-07 ENCOUNTER — Other Ambulatory Visit: Payer: Self-pay

## 2019-05-07 ENCOUNTER — Encounter: Payer: Self-pay | Admitting: Podiatry

## 2019-05-07 DIAGNOSIS — R234 Changes in skin texture: Secondary | ICD-10-CM

## 2019-05-07 DIAGNOSIS — L989 Disorder of the skin and subcutaneous tissue, unspecified: Secondary | ICD-10-CM | POA: Diagnosis not present

## 2019-05-09 NOTE — Progress Notes (Signed)
   HPI: 43 y.o. female presenting today for follow up evaluation of bilateral heel fissures. She has been using the Urea 40% cream which has been helping to alleviate his symptoms. She denies any worsening factors at this time. Patient is here for further evaluation and treatment.   Past Medical History:  Diagnosis Date  . Abnormal Pap smear HX OF LEEP   . No pertinent past medical history   . Palpitations   . PVC's (premature ventricular contractions)   . Vaginal delivery 05/28/2011     Physical Exam: General: The patient is alert and oriented x3 in no acute distress.  Dermatology: Bilateral heel fissures noted. Hyperkeratotic lesion(s) present on the bilateral feet. Pain on palpation with a central nucleated core noted. Skin is warm, dry and supple bilateral lower extremities. Negative for open lesions or macerations.  Vascular: Palpable pedal pulses bilaterally. No edema or erythema noted. Capillary refill within normal limits.  Neurological: Epicritic and protective threshold grossly intact bilaterally.   Musculoskeletal Exam: Range of motion within normal limits to all pedal and ankle joints bilateral. Muscle strength 5/5 in all groups bilateral.   Assessment: 1. Heel fissures bilateral - improved 2. Calluses bilateral x 4   Plan of Care:  1. Patient evaluated.  2. Continue using Urea 40% daily.  3. Excisional debridement of keratotic lesion(s) using a chisel blade was performed without incident. Light dressing applied.  4. Return to clinic as needed.    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      Edrick Kins, DPM [facility]  Dr. Edrick Kins, DPM    [address], [place] [address]                Office [phone]  Fax [phone]

## 2019-07-18 DIAGNOSIS — D2262 Melanocytic nevi of left upper limb, including shoulder: Secondary | ICD-10-CM | POA: Diagnosis not present

## 2019-07-18 DIAGNOSIS — D2271 Melanocytic nevi of right lower limb, including hip: Secondary | ICD-10-CM | POA: Diagnosis not present

## 2019-07-18 DIAGNOSIS — D225 Melanocytic nevi of trunk: Secondary | ICD-10-CM | POA: Diagnosis not present

## 2019-07-18 DIAGNOSIS — D2261 Melanocytic nevi of right upper limb, including shoulder: Secondary | ICD-10-CM | POA: Diagnosis not present

## 2019-08-08 ENCOUNTER — Ambulatory Visit (INDEPENDENT_AMBULATORY_CARE_PROVIDER_SITE_OTHER): Payer: BC Managed Care – PPO | Admitting: Internal Medicine

## 2019-08-08 ENCOUNTER — Encounter: Payer: Self-pay | Admitting: Internal Medicine

## 2019-08-08 ENCOUNTER — Other Ambulatory Visit: Payer: Self-pay

## 2019-08-08 VITALS — BP 110/80 | HR 64 | Ht 61.0 in | Wt 143.5 lb

## 2019-08-08 DIAGNOSIS — R079 Chest pain, unspecified: Secondary | ICD-10-CM

## 2019-08-08 DIAGNOSIS — I493 Ventricular premature depolarization: Secondary | ICD-10-CM

## 2019-08-08 DIAGNOSIS — Z8249 Family history of ischemic heart disease and other diseases of the circulatory system: Secondary | ICD-10-CM | POA: Diagnosis not present

## 2019-08-08 NOTE — Patient Instructions (Addendum)
Medication Instructions:  - Your physician recommends that you continue on your current medications as directed. Please refer to the Current Medication list given to you today.  *If you need a refill on your cardiac medications before your next appointment, please call your pharmacy*  Lab Work: - none ordered  If you have labs (blood work) drawn today and your tests are completely normal, you will receive your results only by: Marland Kitchen MyChart Message (if you have MyChart) OR . A paper copy in the mail If you have any lab test that is abnormal or we need to change your treatment, we will call you to review the results.  Testing/Procedures: - Your physician has recommended that you have a CT Calcium Score done. There are no special instructions for this test.  Please call [phone] to schedule [facility] office) [address] $150 out of pocket at the time of check in  Follow-Up: At [facility], you and your health needs are our priority.  As part of our continuing mission to provide you with exceptional heart care, we have created designated Provider Care Teams.  These Care Teams include your primary Cardiologist (physician) and Advanced Practice Providers (APPs -  Physician Assistants and Nurse Practitioners) who all work together to provide you with the care you need, when you need it.  Your next appointment:   1 year(s)  The format for your next appointment:   In Person  Provider:   Virl Axe, MD  Other Instructions n/a

## 2019-08-08 NOTE — Progress Notes (Signed)
Patient Care Team: Lyndon Code, MD as PCP - General (Internal Medicine)   HPI  Erica Carpenter is a 43 y.o. female Seen in follow-up for symptomatic PVCs having seen Dr. Leonia Reeves 4/14.  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  By her maternal aunt.  Her maternal cousin has a defibrillator; we have no information regarding her gene testing.  Maternal aunts family is followed at [facility] I think her maternal uncle's family is followed at [facility]    She has two children (Erica Carpenter/Erica Carpenter) follow-up echocardiogram 10/16 demonstrated mild depression of LV systolic function with an EF of 45-50%.        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  There have been no palpitations, lightheadedness or syncope.  Does have vague chest discomfort described as pressure nonradiating.  Unassociated with exertion.     Past Medical History:  Diagnosis Date  . Abnormal Pap smear HX OF LEEP   . No pertinent past medical history   . Palpitations   . PVC's (premature ventricular contractions)   . Vaginal delivery 05/28/2011    Past Surgical History:  Procedure Laterality Date  . NO PAST SURGERIES      Current Outpatient Medications  Medication Sig Dispense Refill  . cetirizine (ZYRTEC) 10 MG tablet Take 10 mg by mouth daily as needed for allergies.    . fluticasone (FLONASE) 50 MCG/ACT nasal spray 1 spray by Each Nare route daily prn.    . ibuprofen (ADVIL,MOTRIN) 600 MG tablet Take 1 tablet (600 mg total) by mouth every 6 (six) hours. (Patient taking differently: Take 600 mg by mouth every 6 (six) hours as needed. ) 30 tablet 1  . Multiple Vitamin (MULTIVITAMIN)  tablet Take 1 tablet by mouth daily.     No current facility-administered medications for this visit.     No Known Allergies    Review of Systems negative except from HPI and PMH  Physical Exam BP 110/80 (BP Location: Left Arm, Patient Position: Sitting, Cuff Size: Normal)   Pulse 64   Ht 5\' 1"  (1.549 m)   Wt 143 lb 8 oz (65.1 kg)   SpO2 99%   BMI 27.11 kg/m  Well developed and nourished in no acute distress HENT normal Neck supple    Clear Regular rate and rhythm, no murmurs or gallops Abd-soft  No Clubbing cyanosis edema Skin-warm and dry A & Oriented  Grossly normal sensory and motor function  ECG sinus at 64 Interval 13/06/40 Otherwise normal   Assessment and  Plan  PVCs-quiescent  Family history of cardiomyopathy maternal aunt with DSP gene  Presyncope  Chest Pain  Atypical   DSP gene positive (ARVC/AC)   I  Will need surveillance over time regarding her arrhythmogenic cardiomyopathy genotype.  There is a mild abnormality of the phenotype.  Chest pain is atypical.  We will undertake a calcium score--significant anxiety related to her cardiac issues given her genotypic positivity  Reviewed symptoms of palpitations and syncope.  We spent more than 50% of our >25 min visit in face to face counseling  regarding the above

## 2019-09-24 ENCOUNTER — Inpatient Hospital Stay: Admission: RE | Admit: 2019-09-24 | Payer: BC Managed Care – PPO | Source: Ambulatory Visit

## 2019-10-04 ENCOUNTER — Other Ambulatory Visit: Payer: Self-pay

## 2019-10-04 ENCOUNTER — Ambulatory Visit (INDEPENDENT_AMBULATORY_CARE_PROVIDER_SITE_OTHER)
Admission: RE | Admit: 2019-10-04 | Discharge: 2019-10-04 | Disposition: A | Discharge status: Home / Self Care | Payer: Self-pay | Source: Ambulatory Visit | Attending: Internal Medicine | Admitting: Internal Medicine

## 2019-10-04 DIAGNOSIS — R079 Chest pain, unspecified: Secondary | ICD-10-CM

## 2019-10-04 DIAGNOSIS — Z30433 Encounter for removal and reinsertion of intrauterine contraceptive device: Secondary | ICD-10-CM | POA: Diagnosis not present

## 2019-10-18 DIAGNOSIS — Z4420 Encounter for fitting and adjustment of artificial eye, unspecified: Secondary | ICD-10-CM | POA: Diagnosis not present

## 2019-10-28 DIAGNOSIS — Z131 Encounter for screening for diabetes mellitus: Secondary | ICD-10-CM | POA: Diagnosis not present

## 2019-10-28 DIAGNOSIS — Z1321 Encounter for screening for nutritional disorder: Secondary | ICD-10-CM | POA: Diagnosis not present

## 2019-10-28 DIAGNOSIS — Z30431 Encounter for routine checking of intrauterine contraceptive device: Secondary | ICD-10-CM | POA: Diagnosis not present

## 2019-10-28 DIAGNOSIS — Z13228 Encounter for screening for other metabolic disorders: Secondary | ICD-10-CM | POA: Diagnosis not present

## 2019-10-28 DIAGNOSIS — Z1231 Encounter for screening mammogram for malignant neoplasm of breast: Secondary | ICD-10-CM | POA: Diagnosis not present

## 2019-10-28 DIAGNOSIS — Z1329 Encounter for screening for other suspected endocrine disorder: Secondary | ICD-10-CM | POA: Diagnosis not present

## 2019-10-28 DIAGNOSIS — Z6826 Body mass index (BMI) 26.0-26.9, adult: Secondary | ICD-10-CM | POA: Diagnosis not present

## 2019-10-28 DIAGNOSIS — Z01419 Encounter for gynecological examination (general) (routine) without abnormal findings: Secondary | ICD-10-CM | POA: Diagnosis not present

## 2019-11-10 DIAGNOSIS — Z20828 Contact with and (suspected) exposure to other viral communicable diseases: Secondary | ICD-10-CM | POA: Diagnosis not present

## 2019-11-18 DIAGNOSIS — Z03818 Encounter for observation for suspected exposure to other biological agents ruled out: Secondary | ICD-10-CM | POA: Diagnosis not present

## 2019-12-24 DIAGNOSIS — Z4420 Encounter for fitting and adjustment of artificial eye, unspecified: Secondary | ICD-10-CM | POA: Diagnosis not present

## 2020-01-06 DIAGNOSIS — H9201 Otalgia, right ear: Secondary | ICD-10-CM | POA: Diagnosis not present

## 2020-01-06 DIAGNOSIS — H6121 Impacted cerumen, right ear: Secondary | ICD-10-CM | POA: Diagnosis not present

## 2020-02-10 DIAGNOSIS — H524 Presbyopia: Secondary | ICD-10-CM | POA: Diagnosis not present

## 2020-02-10 DIAGNOSIS — H5202 Hypermetropia, left eye: Secondary | ICD-10-CM | POA: Diagnosis not present

## 2020-02-10 DIAGNOSIS — H43392 Other vitreous opacities, left eye: Secondary | ICD-10-CM | POA: Diagnosis not present

## 2020-02-10 DIAGNOSIS — H52222 Regular astigmatism, left eye: Secondary | ICD-10-CM | POA: Diagnosis not present

## 2020-02-12 DIAGNOSIS — H43822 Vitreomacular adhesion, left eye: Secondary | ICD-10-CM | POA: Diagnosis not present

## 2020-02-12 DIAGNOSIS — Z970 Presence of artificial eye: Secondary | ICD-10-CM | POA: Diagnosis not present

## 2020-06-26 DIAGNOSIS — Z4420 Encounter for fitting and adjustment of artificial eye, unspecified: Secondary | ICD-10-CM | POA: Diagnosis not present

## 2020-07-17 DIAGNOSIS — D225 Melanocytic nevi of trunk: Secondary | ICD-10-CM | POA: Diagnosis not present

## 2020-07-17 DIAGNOSIS — D2261 Melanocytic nevi of right upper limb, including shoulder: Secondary | ICD-10-CM | POA: Diagnosis not present

## 2020-07-17 DIAGNOSIS — D2271 Melanocytic nevi of right lower limb, including hip: Secondary | ICD-10-CM | POA: Diagnosis not present

## 2020-07-17 DIAGNOSIS — D2262 Melanocytic nevi of left upper limb, including shoulder: Secondary | ICD-10-CM | POA: Diagnosis not present

## 2020-09-18 DIAGNOSIS — U071 COVID-19: Secondary | ICD-10-CM | POA: Diagnosis not present

## 2020-09-21 DIAGNOSIS — Z03818 Encounter for observation for suspected exposure to other biological agents ruled out: Secondary | ICD-10-CM | POA: Diagnosis not present

## 2020-09-21 DIAGNOSIS — Z20822 Contact with and (suspected) exposure to covid-19: Secondary | ICD-10-CM | POA: Diagnosis not present

## 2020-10-09 ENCOUNTER — Other Ambulatory Visit: Payer: Self-pay

## 2020-10-09 ENCOUNTER — Ambulatory Visit (INDEPENDENT_AMBULATORY_CARE_PROVIDER_SITE_OTHER): Payer: BC Managed Care – PPO | Admitting: Physician Assistant

## 2020-10-09 ENCOUNTER — Encounter: Payer: Self-pay | Admitting: Physician Assistant

## 2020-10-09 VITALS — BP 108/76 | HR 79 | Ht 61.0 in | Wt 128.5 lb

## 2020-10-09 DIAGNOSIS — I493 Ventricular premature depolarization: Secondary | ICD-10-CM | POA: Diagnosis not present

## 2020-10-09 DIAGNOSIS — Z8249 Family history of ischemic heart disease and other diseases of the circulatory system: Secondary | ICD-10-CM

## 2020-10-09 DIAGNOSIS — Z01812 Encounter for preprocedural laboratory examination: Secondary | ICD-10-CM

## 2020-10-09 DIAGNOSIS — Z01810 Encounter for preprocedural cardiovascular examination: Secondary | ICD-10-CM | POA: Diagnosis not present

## 2020-10-09 DIAGNOSIS — R002 Palpitations: Secondary | ICD-10-CM

## 2020-10-09 NOTE — Patient Instructions (Signed)
Medication Instructions:  Your physician recommends that you continue on your current medications as directed. Please refer to the Current Medication list given to you today.  *If you need a refill on your cardiac medications before your next appointment, please call your pharmacy*   Lab Work: Your physician recommends that you have lab work TODAY in the clinic: CBC w/ Diff, PLT, Cmet, Hgb A1c, PT/INR, PTT  If you have labs (blood work) drawn today and your tests are completely normal, you will receive your results only by: Marland Kitchen MyChart Message (if you have MyChart) OR . A paper copy in the mail If you have any lab test that is abnormal or we need to change your treatment, we will call you to review the results.   Testing/Procedures: None ordered   Follow-Up: At [facility], you and your health needs are our priority.  As part of our continuing mission to provide you with exceptional heart care, we have created designated Provider Care Teams.  These Care Teams include your primary Cardiologist (physician) and Advanced Practice Providers (APPs -  Physician Assistants and Nurse Practitioners) who all work together to provide you with the care you need, when you need it.  We recommend signing up for the patient portal called "MyChart".  Sign up information is provided on this After Visit Summary.  MyChart is used to connect with patients for Virtual Visits (Telemedicine).  Patients are able to view lab/test results, encounter notes, upcoming appointments, etc.  Non-urgent messages can be sent to your provider as well.   To learn more about what you can do with MyChart, go to [email or web address].    Your next appointment:   1 year(s)  The format for your next appointment:   In Person  Provider:   You may see Dr. Graciela Husbands

## 2020-10-09 NOTE — Progress Notes (Signed)
Office Visit    Patient Name: Erica Carpenter Date of Encounter: 10/09/2020  Primary Care Provider:  Lyndon Code, MD Primary Cardiologist: Dr. Graciela Husbands  Chief Complaint    Chief Complaint  Patient presents with  . 12 month follow up     "doing well." Medications reviewed by the patient verbally.     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  Past Medical History    Past Medical History:  Diagnosis Date  . Abnormal Pap smear HX OF LEEP   . No pertinent past medical history   . Palpitations   . PVC's (premature ventricular contractions)   . Vaginal delivery 05/28/2011   Past Surgical History:  Procedure Laterality Date  . NO PAST SURGERIES      Allergies  No Known Allergies  History of Present Illness    Erica Carpenter is a 45 y.o. female with PMH as above.  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    06/2015 echo with EF 45 to 50%, lateral -apical regional hypokinesis, mild LVH.    07/2015 cardiac monitoring showed NSR with PACs, PVCs, and no other significant arrhythmia.    08/2015 cardiac MRI showed mildly dilated left ventricle with low normal systolic function LVEF 53%.  There was mild hypokinesis of the apical segments.  There is increased trabeculation of the apical and mid segments with noncompacted to compacted ratio > 2.3:1.  There was no late gadolinium enhancement.  Trivial TR.  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  10/2016 cardiac MRI showed normal LV size with suspected noncompaction in the mid to apical LVEF 52%, mild diffuse hypokinesis.  No myocardial you LGE, so no evidence for prior MI,  infiltrative disease, or myocarditis.  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  She was last seen by her primary cardiologist 08/08/2019.  At that time, it was noted that continued surveillance was needed regarding her arrhythmogenic cardiomyopathy genotype.  She had tested positive for the DSP gene (ARVC/AC).  There was noted to be a mild abnormality of the phenotype.  She had reported atypical chest pain.  Recommendation was to obtain CT cardiac scoring.  09/2019 CT cardiac scoring obtained with CAC score of 0.  Today, 10/09/2020, she returns to clinic and notes she is overall doing well from a cardiac standpoint.  She denies any chest pain, racing heart rate, palpitations, presyncope, syncope.  No reported signs or symptoms of volume overload.  She reports a heart healthy diet and regular routine exercise.  She frequently completes very demanding cardiovascular exercise with "Boot camp," such as [employer], and without any symptoms concerning for angina.  EKG today is without acute ST/T changes.  She requests preoperative labs and cardiovascular evaluation today.  Home Medications    Current Outpatient Medications on File Prior to Visit  Medication Sig Dispense Refill  . cetirizine (ZYRTEC) 10 MG tablet Take 10 mg by mouth daily as needed for allergies.    . fluticasone (FLONASE) 50 MCG/ACT nasal spray 1 spray by Each Nare route daily prn.    . ibuprofen (ADVIL,MOTRIN) 600 MG tablet Take 1 tablet (600 mg total) by mouth every 6 (six) hours. (Patient taking differently: Take 600 mg by mouth every  6 (six) hours as needed.) 30 tablet 1  . Multiple Vitamin (MULTIVITAMIN) tablet Take 1 tablet by mouth daily.     No current facility-administered medications on file prior to visit.    Review of Systems    She denies chest pain, palpitations, dyspnea, pnd, orthopnea, n, v, dizziness, syncope, edema, weight gain, or early satiety.   All other systems reviewed and are otherwise negative except  as noted above.  Physical Exam    VS:  BP 108/76 (BP Location: Left Arm, Patient Position: Sitting, Cuff Size: Normal)   Pulse 79   Ht 5\' 1"  (1.549 m)   Wt 128 lb 8 oz (58.3 kg)   SpO2 98%   BMI 24.28 kg/m  , BMI Body mass index is 24.28 kg/m. GEN: Well nourished, well developed, in no acute distress. HEENT: normal. Neck: Supple, no JVD, carotid bruits, or masses. Cardiac: RRR, no murmurs, rubs, or gallops. No clubbing, cyanosis, edema.  Radials/DP/PT 2+ and equal bilaterally.  Respiratory:  Respirations regular and unlabored, clear to auscultation bilaterally. GI: Soft, nontender, nondistended, BS + x 4. MS: no deformity or atrophy. Skin: warm and dry, no rash. Neuro:  Strength and sensation are intact. Psych: Normal affect.  Accessory Clinical Findings    ECG personally reviewed by me today -NSR, 79 bpm, TWI as seen 04/26/2018 in V1/V2, PR interval 136, QTc 451, no acute changes- no acute changes.  VITALS Reviewed today   Temp Readings from Last 3 Encounters:  04/09/19 98 F (36.7 C)  08/16/18 98 F (36.7 C)  08/13/14 98.2 F (36.8 C) (Oral)   BP Readings from Last 3 Encounters:  10/09/20 108/76  08/08/19 110/80  08/16/18 115/89   Pulse Readings from Last 3 Encounters:  10/09/20 79  08/08/19 64  08/16/18 99    Wt Readings from Last 3 Encounters:  10/09/20 128 lb 8 oz (58.3 kg)  08/08/19 143 lb 8 oz (65.1 kg)  08/16/18 137 lb 12.8 oz (62.5 kg)     LABS  reviewed today   Lab Results  Component Value Date   WBC 13.5 (H) 08/13/2014   HGB 10.7 (L) 08/13/2014   HCT 31.5 (L) 08/13/2014   MCV 85.6 08/13/2014   PLT 173 08/13/2014   Lab Results  Component Value Date   CREATININE 0.74 10/26/2016   No results found for: ALT, AST, GGT, ALKPHOS, BILITOT No results found for: CHOL, HDL, LDLCALC, LDLDIRECT, TRIG, CHOLHDL  No results found for: HGBA1C No results found for: TSH   STUDIES/PROCEDURES reviewed today   CT cardiac scoring 10/04/2019 Coronary  calcium score of 0.  MR cardiac 10/26/2016 1. Normal left ventricular size with suspected noncompaction in the mid to apical LV. EF 52%, mild diffuse hypokinesis. 2. Normal right ventricular size and systolic function, EF 45% (within normal range for RV). 3. No myocardial LGE, so no evidence for prior MI, infiltrative disease, or myocarditis. 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  MR cardiac 08/19/2015 1. Mildly dilated left ventricle with normal thickness and low normal systolic function (LVEF = 53%). There is mild hypokinesis of the apical segments. There is increased trabeculation of the apical and mid segments with non-compacted to compacted ratio > 2.3:1. There is no late gadolinium enhancement. 2. Normal right ventricular size, thickness and systolic function (RVEF = 56%). There are no regional wall motion abnormalities. 3.  Trivial tricuspid regurgitation. 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  Cardiac monitoring 07/27/2015 Holter monitor report Normal sinus rhythm with rare APC, PVC No  other significant arrhythmia Symptoms detailed did not correlate with arrhythmia  Echo 07/06/2015 Left ventricle: The cavity size was normal. Systolic function was  mildly reduced. The estimated ejection fraction was in the range  of 45% to 50%. Lateral and apical region with hypokinesis,  notable on apical images. Mild wall thickening noted, unable to  exclude noncompaction. Left ventricular diastolic function  parameters were normal.  - Left atrium: The atrium was normal in size.  - Right ventricle: Systolic function was normal.  - Pulmonary arteries: Systolic pressure was within the normal  range.    Assessment & Plan    Preoperative cardiac evaluation --No active cardiac conditions. No recent MI or current unstable or severe angina. Euvolemic and well compensated on exam. No significant arrhythmia on EKG. Cardiac  exam without significant murmurs. Clinical risk factors include positive DSP gene with non-compaction CM. Functional capacity 10+ METs. EKG without acute ST/T changes. No history of ICM with CAC score 0. Surgery specific risk intermediate. Perioperative risk% risk of MACE. No indication for a repeat echo on review of HPI or by cardiac ausculation today. Reasonable to proceed with cosmetic surgery without additional cardiac testing or intervention.  She is not on any antiplatelet or anticoagulation therapy that needs held prior to surgery. Will obtain preoperative labs as requested by the below practice.  PAC/PVCs --No recurrent sx. No ectopy on EKG today. Previous cardiac monitoring as above without significant arrhythmia.  Family history of CM Positive DSP gene (ARVC/AC) --Continue annual surveillance given her arrhythmogenic CM genotype. Previous workup and monitoring as above under CV studies. 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 Continue to monitor with annual visit with Dr. Graciela Husbands.   Atypical CP --No recurrence. CAC score 0. EKG without acute ST/T changes.   Preoperative request from:  [facility] [address] [place], [place] [address] Phone: [phone] Fax: [phone] [email or web address] Please fax results to [phone]  Medication changes:  None Labs ordered:  CBC with differential and platelet CMP Hemoglobin A1c Prothrombin time with INR PTT Studies / Imaging ordered:  None  Disposition: RTC as indicated to see Dr. Graciela Husbands on annual basis for DSP gene monitoring   Lennon Alstrom, PA-C 10/09/2020

## 2020-10-10 LAB — CBC WITH DIFFERENTIAL/PLATELET
Basophils Absolute: 0 10*3/uL (ref 0.0–0.2)
Basos: 0 %
EOS (ABSOLUTE): 0 10*3/uL (ref 0.0–0.4)
Eos: 1 %
Hematocrit: 38.6 % (ref 34.0–46.6)
Hemoglobin: 13.4 g/dL (ref 11.1–15.9)
Immature Grans (Abs): 0 10*3/uL (ref 0.0–0.1)
Immature Granulocytes: 0 %
Lymphocytes Absolute: 1.2 10*3/uL (ref 0.7–3.1)
Lymphs: 16 %
MCH: 29.9 pg (ref 26.6–33.0)
MCHC: 34.7 g/dL (ref 31.5–35.7)
MCV: 86 fL (ref 79–97)
Monocytes Absolute: 0.5 10*3/uL (ref 0.1–0.9)
Monocytes: 7 %
Neutrophils Absolute: 5.8 10*3/uL (ref 1.4–7.0)
Neutrophils: 76 %
Platelets: 260 10*3/uL (ref 150–450)
RBC: 4.48 x10E6/uL (ref 3.77–5.28)
RDW: 12.3 % (ref 11.7–15.4)
WBC: 7.6 10*3/uL (ref 3.4–10.8)

## 2020-10-10 LAB — COMPREHENSIVE METABOLIC PANEL
ALT: 11 IU/L (ref 0–32)
AST: 19 IU/L (ref 0–40)
Albumin/Globulin Ratio: 1.8 (ref 1.2–2.2)
Albumin: 4.7 g/dL (ref 3.8–4.8)
Alkaline Phosphatase: 55 IU/L (ref 44–121)
BUN/Creatinine Ratio: 22 (ref 9–23)
BUN: 17 mg/dL (ref 6–24)
Bilirubin Total: 0.5 mg/dL (ref 0.0–1.2)
CO2: 20 mmol/L (ref 20–29)
Calcium: 9.2 mg/dL (ref 8.7–10.2)
Chloride: 104 mmol/L (ref 96–106)
Creatinine, Ser: 0.76 mg/dL (ref 0.57–1.00)
GFR calc Af Amer: 112 mL/min/{1.73_m2} (ref >59)
GFR calc non Af Amer: 97 mL/min/{1.73_m2} (ref >59)
Globulin, Total: 2.6 g/dL (ref 1.5–4.5)
Glucose: 92 mg/dL (ref 65–99)
Potassium: 4.1 mmol/L (ref 3.5–5.2)
Sodium: 138 mmol/L (ref 134–144)
Total Protein: 7.3 g/dL (ref 6.0–8.5)

## 2020-10-10 LAB — PROTIME-INR
INR: 1 (ref 0.9–1.2)
Prothrombin Time: 10.3 s (ref 9.1–12.0)

## 2020-10-10 LAB — APTT: aPTT: 27 s (ref 24–33)

## 2020-10-10 LAB — HEMOGLOBIN A1C
Est. average glucose Bld gHb Est-mCnc: 105 mg/dL
Hgb A1c MFr Bld: 5.3 % (ref 4.8–5.6)

## 2020-10-30 DIAGNOSIS — Z01419 Encounter for gynecological examination (general) (routine) without abnormal findings: Secondary | ICD-10-CM | POA: Diagnosis not present

## 2020-10-30 DIAGNOSIS — Z1231 Encounter for screening mammogram for malignant neoplasm of breast: Secondary | ICD-10-CM | POA: Diagnosis not present

## 2020-10-30 DIAGNOSIS — Z6824 Body mass index (BMI) 24.0-24.9, adult: Secondary | ICD-10-CM | POA: Diagnosis not present

## 2020-11-19 DIAGNOSIS — Z20822 Contact with and (suspected) exposure to covid-19: Secondary | ICD-10-CM | POA: Diagnosis not present

## 2020-12-07 ENCOUNTER — Other Ambulatory Visit: Payer: Self-pay

## 2021-01-01 DIAGNOSIS — Z4420 Encounter for fitting and adjustment of artificial eye, unspecified: Secondary | ICD-10-CM | POA: Diagnosis not present

## 2021-04-28 DIAGNOSIS — H10233 Serous conjunctivitis, except viral, bilateral: Secondary | ICD-10-CM | POA: Diagnosis not present

## 2021-04-28 DIAGNOSIS — H5202 Hypermetropia, left eye: Secondary | ICD-10-CM | POA: Diagnosis not present

## 2021-04-28 DIAGNOSIS — H02885 Meibomian gland dysfunction left lower eyelid: Secondary | ICD-10-CM | POA: Diagnosis not present

## 2021-04-28 DIAGNOSIS — H02882 Meibomian gland dysfunction right lower eyelid: Secondary | ICD-10-CM | POA: Diagnosis not present

## 2021-05-12 DIAGNOSIS — H52222 Regular astigmatism, left eye: Secondary | ICD-10-CM | POA: Diagnosis not present

## 2021-05-12 DIAGNOSIS — Z970 Presence of artificial eye: Secondary | ICD-10-CM | POA: Diagnosis not present

## 2021-05-12 DIAGNOSIS — H04122 Dry eye syndrome of left lacrimal gland: Secondary | ICD-10-CM | POA: Diagnosis not present

## 2021-05-12 DIAGNOSIS — H524 Presbyopia: Secondary | ICD-10-CM | POA: Diagnosis not present

## 2021-05-12 DIAGNOSIS — H5202 Hypermetropia, left eye: Secondary | ICD-10-CM | POA: Diagnosis not present

## 2021-05-12 DIAGNOSIS — H43392 Other vitreous opacities, left eye: Secondary | ICD-10-CM | POA: Diagnosis not present

## 2021-05-12 DIAGNOSIS — H35372 Puckering of macula, left eye: Secondary | ICD-10-CM | POA: Diagnosis not present

## 2021-06-18 DIAGNOSIS — Z4420 Encounter for fitting and adjustment of artificial eye, unspecified: Secondary | ICD-10-CM | POA: Diagnosis not present

## 2021-06-29 IMAGING — CT CT CARDIAC CORONARY ARTERY CALCIUM SCORE
3 series · 14 of 20 positions shown, 15 images · non-contrast
Comparison: None.
COMPARISON: None.

Addendum:
EXAM:
OVER-READ INTERPRETATION  CT CHEST

The following report is an over-read performed by radiologist Dr.
Ema Ye Nashikaku [REDACTED] on 10/04/2019. This
over-read does not include interpretation of cardiac or coronary
anatomy or pathology. The coronary calcium score interpretation by
the cardiologist is attached.
CLINICAL DATA: Risk stratification
Coronary Calcium Score
TECHNIQUE: The patient was scanned on a Siemens Somatom 64 slice scanner. Axial
non-contrast 3 mm slices were carried out through the heart. The
data set was analyzed on a dedicated work station and scored using
the Agatson method.

[Series 2: casc 3.0 bv41 2 bestdiast 72 % · axial · 0.33mm/px · z∈[-201,-126]mm · 4 of 43 slices shown, 5 images]
[im 9/43  vessel]
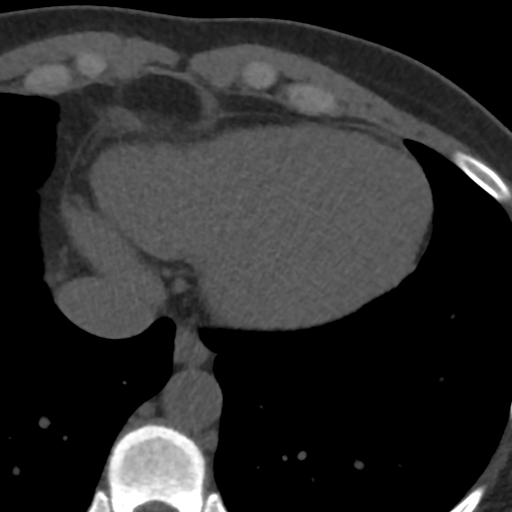
[im 9/43  lung]
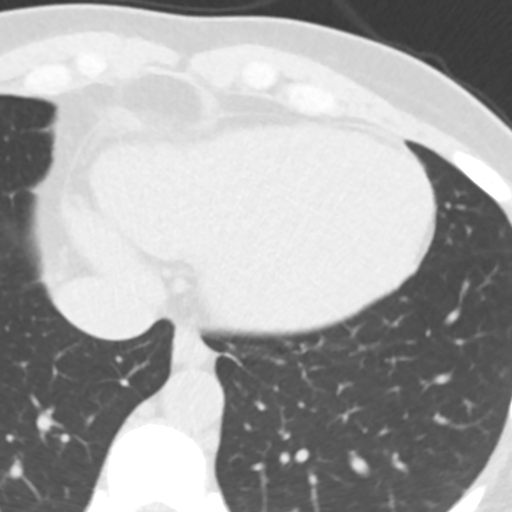
[im 17/43  vessel]
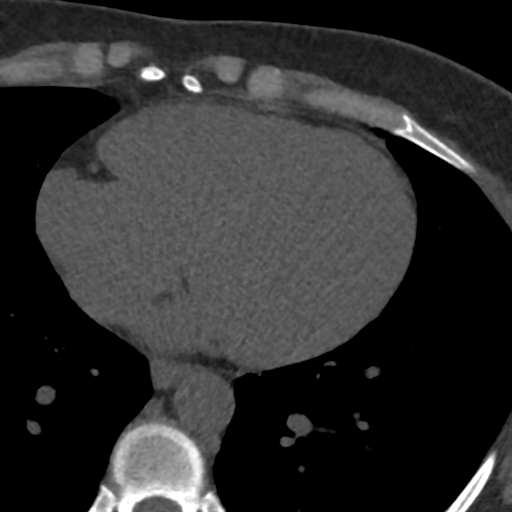
[im 26/43  vessel]
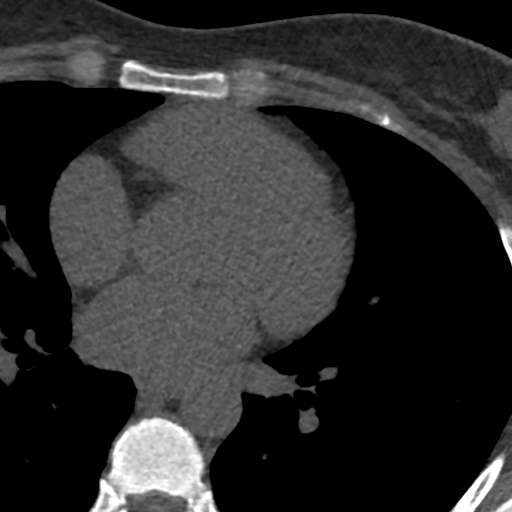
[im 34/43  vessel]
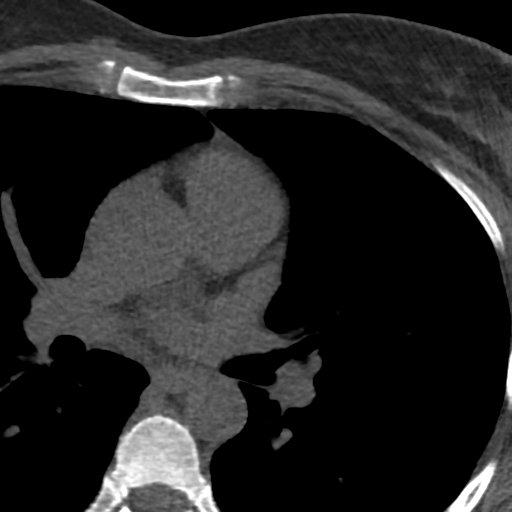

[Series 3: lung 69 % · axial · 0.62mm/px · z∈[-204,-120]mm · 5 of 43 slices shown]
[im 8/43  lung]
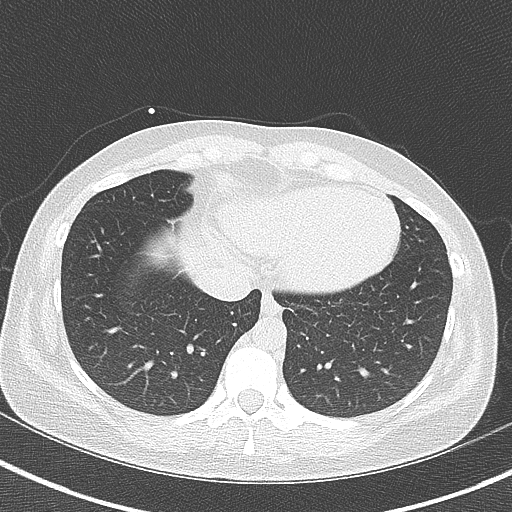
[im 15/43  lung]
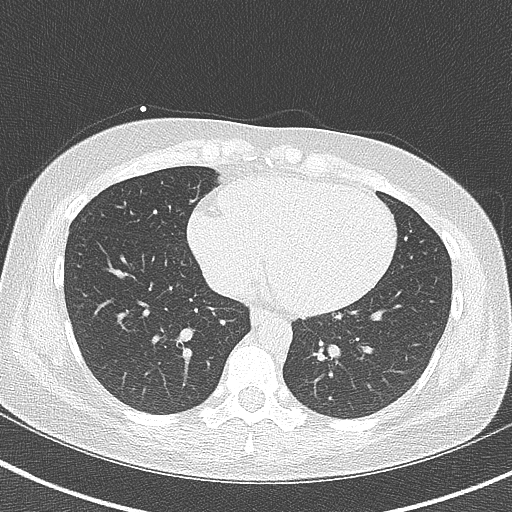
[im 22/43  lung]
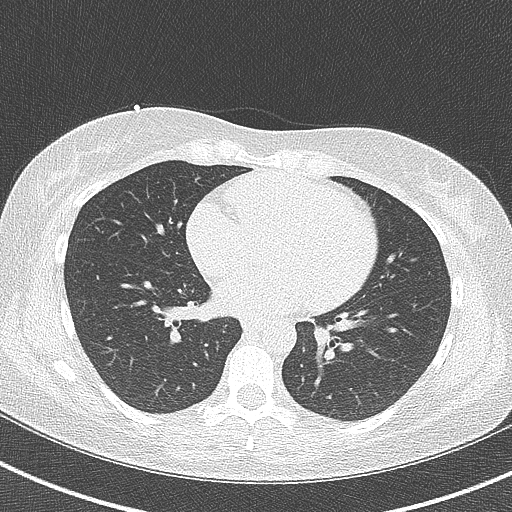
[im 29/43  lung]
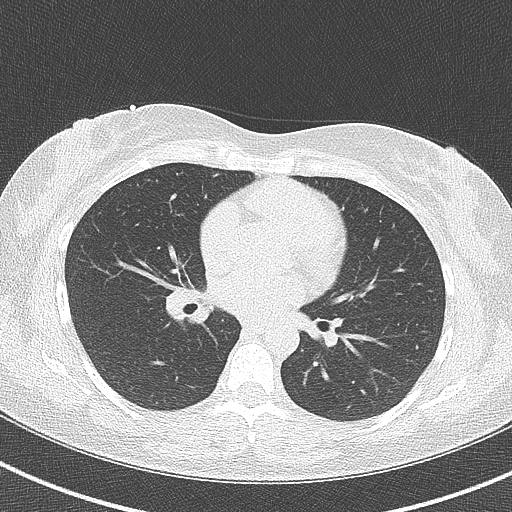
[im 36/43  lung]
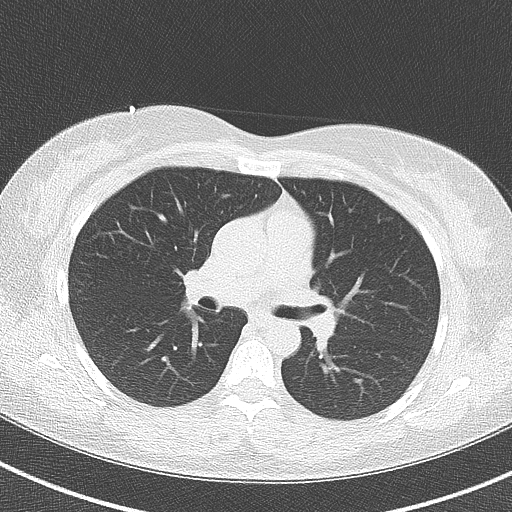

[Series 4: lung st 69 % · axial · 0.62mm/px · z∈[-204,-120]mm · 5 of 43 slices shown]
[im 8/43  lung]
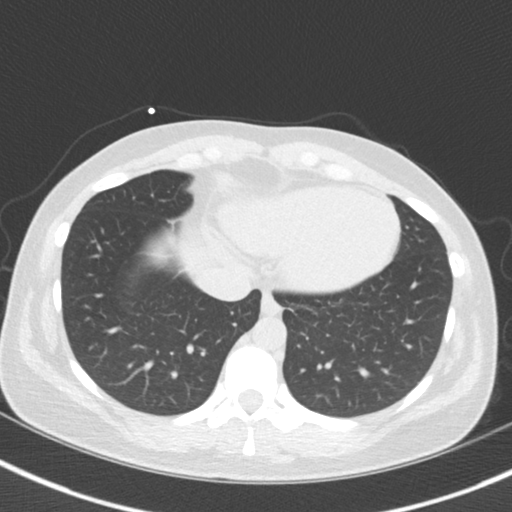
[im 15/43  lung]
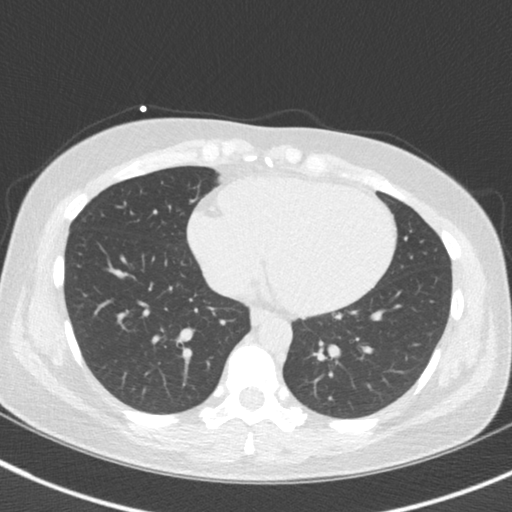
[im 22/43  lung]
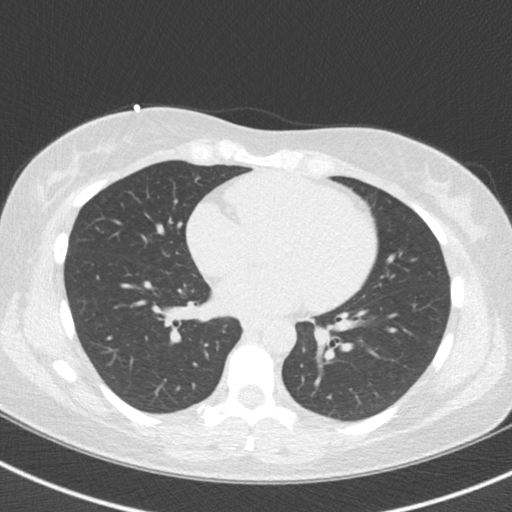
[im 29/43  lung]
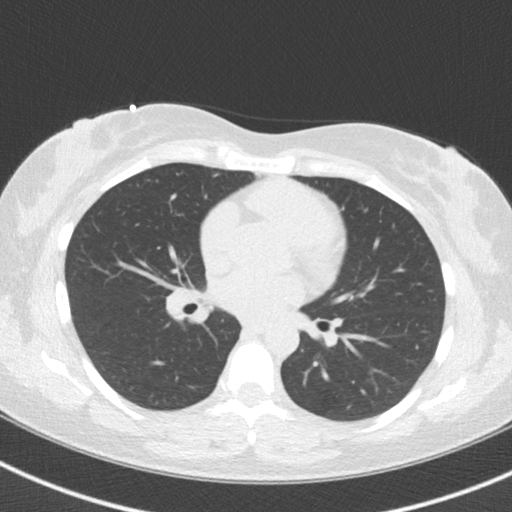
[im 36/43  lung]
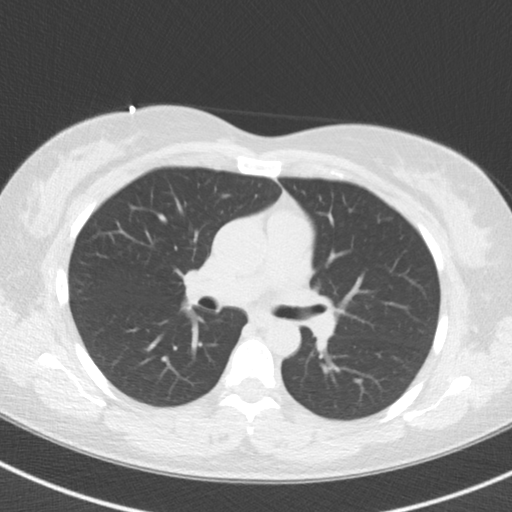

[14 of 20 positions shown; findings below may reference images not displayed]

FINDINGS: Within the visualized portions of the thorax there are no suspicious
appearing pulmonary nodules or masses, there is no acute
consolidative airspace disease, no pleural effusions, no
pneumothorax and no lymphadenopathy. Visualized portions of the
upper abdomen are unremarkable. There are no aggressive appearing
lytic or blastic lesions noted in the visualized portions of the
skeleton.
IMPRESSION: No significant incidental noncardiac findings are noted.
FINDINGS: Non-cardiac: See separate report from [REDACTED].

Ascending aorta: Normal diameter

Pericardium: Normal

Coronary arteries: No calcium noted
IMPRESSION: Coronary calcium score of 0.

Tesfazgi Deng

*** End of Addendum ***
EXAM:
OVER-READ INTERPRETATION  CT CHEST

The following report is an over-read performed by radiologist Dr.
Ema Ye Nashikaku [REDACTED] on 10/04/2019. This
over-read does not include interpretation of cardiac or coronary
anatomy or pathology. The coronary calcium score interpretation by
the cardiologist is attached.
FINDINGS: Within the visualized portions of the thorax there are no suspicious
appearing pulmonary nodules or masses, there is no acute
consolidative airspace disease, no pleural effusions, no
pneumothorax and no lymphadenopathy. Visualized portions of the
upper abdomen are unremarkable. There are no aggressive appearing
lytic or blastic lesions noted in the visualized portions of the
skeleton.
IMPRESSION: No significant incidental noncardiac findings are noted.

## 2021-07-22 DIAGNOSIS — D2262 Melanocytic nevi of left upper limb, including shoulder: Secondary | ICD-10-CM | POA: Diagnosis not present

## 2021-07-22 DIAGNOSIS — D2271 Melanocytic nevi of right lower limb, including hip: Secondary | ICD-10-CM | POA: Diagnosis not present

## 2021-07-22 DIAGNOSIS — D225 Melanocytic nevi of trunk: Secondary | ICD-10-CM | POA: Diagnosis not present

## 2021-07-22 DIAGNOSIS — D2261 Melanocytic nevi of right upper limb, including shoulder: Secondary | ICD-10-CM | POA: Diagnosis not present

## 2021-11-08 DIAGNOSIS — Z01419 Encounter for gynecological examination (general) (routine) without abnormal findings: Secondary | ICD-10-CM | POA: Diagnosis not present

## 2021-11-08 DIAGNOSIS — Z1231 Encounter for screening mammogram for malignant neoplasm of breast: Secondary | ICD-10-CM | POA: Diagnosis not present

## 2021-11-08 DIAGNOSIS — Z124 Encounter for screening for malignant neoplasm of cervix: Secondary | ICD-10-CM | POA: Diagnosis not present

## 2021-11-08 DIAGNOSIS — Z6825 Body mass index (BMI) 25.0-25.9, adult: Secondary | ICD-10-CM | POA: Diagnosis not present

## 2021-11-16 DIAGNOSIS — Z13228 Encounter for screening for other metabolic disorders: Secondary | ICD-10-CM | POA: Diagnosis not present

## 2021-11-16 DIAGNOSIS — Z131 Encounter for screening for diabetes mellitus: Secondary | ICD-10-CM | POA: Diagnosis not present

## 2021-11-16 DIAGNOSIS — Z130 Encounter for screening for diseases of the blood and blood-forming organs and certain disorders involving the immune mechanism: Secondary | ICD-10-CM | POA: Diagnosis not present

## 2021-11-16 DIAGNOSIS — Z1321 Encounter for screening for nutritional disorder: Secondary | ICD-10-CM | POA: Diagnosis not present

## 2021-11-16 DIAGNOSIS — Z1329 Encounter for screening for other suspected endocrine disorder: Secondary | ICD-10-CM | POA: Diagnosis not present

## 2022-01-06 ENCOUNTER — Ambulatory Visit (INDEPENDENT_AMBULATORY_CARE_PROVIDER_SITE_OTHER): Payer: BC Managed Care – PPO | Admitting: Internal Medicine

## 2022-01-06 ENCOUNTER — Encounter: Payer: Self-pay | Admitting: Internal Medicine

## 2022-01-06 ENCOUNTER — Ambulatory Visit: Payer: BC Managed Care – PPO | Admitting: Internal Medicine

## 2022-01-06 VITALS — BP 110/72 | HR 71 | Ht 61.0 in | Wt 134.4 lb

## 2022-01-06 DIAGNOSIS — I428 Other cardiomyopathies: Secondary | ICD-10-CM | POA: Diagnosis not present

## 2022-01-06 DIAGNOSIS — Z8249 Family history of ischemic heart disease and other diseases of the circulatory system: Secondary | ICD-10-CM

## 2022-01-06 DIAGNOSIS — R002 Palpitations: Secondary | ICD-10-CM

## 2022-01-06 DIAGNOSIS — I493 Ventricular premature depolarization: Secondary | ICD-10-CM | POA: Diagnosis not present

## 2022-01-06 NOTE — Progress Notes (Signed)
? ? ? ? ?Patient Care Team: ?Lyndon Code, MD as PCP - General (Internal Medicine) ? ? ?HPI ? ?Erica Carpenter is a 46 y.o. female ?Seen in follow-up for symptomatic PVCs having seen Dr. Leonia Reeves 4/14.  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 ?This is the same gene.  By her maternal aunt.  Her maternal cousin has a defibrillator; we have no information regarding her gene testing.  Maternal aunts family is followed at [facility] I think her maternal uncle's family is followed at [facility] ? ?The patient denies chest pain, shortness of breath, nocturnal dyspnea, orthopnea or peripheral edema.  There have been lightheadedness or syncope.   ?Scant palpitations, fleeting and without other symptoms  ?  ?She has two children ([name]) follow-up echocardiogram   ? ?Date QRSd Swave upstroke V1-3 TWI  ?12/20 67 30 none  ?4/23 66 40-45 none  ? ? ? ?DATE TEST EF   ?2012 Echo  normal   ?10/16 Echo  45%   ?12/16 MRI   53 % Ratio 2.3:1//concerning for noncompaction   ?2/18 MRI   52 % RATIO 2.3:1  Suspected noncompaction  ?1/21 CTscore  CaScore 0  ?  ? ?  ? ?Past Medical History:  ?Diagnosis Date  ? Abnormal Pap smear HX OF LEEP   ? No pertinent past medical history   ? Palpitations   ? PVC's (premature ventricular contractions)   ? Vaginal delivery 05/28/2011  ? ? ?Past Surgical History:  ?Procedure Laterality Date  ? NO PAST SURGERIES    ? ? ?Current Outpatient Medications  ?Medication Sig Dispense Refill  ? cetirizine (ZYRTEC) 10 MG tablet Take 10 mg by mouth daily as needed for allergies.    ? fluticasone (FLONASE) 50 MCG/ACT nasal spray 1 spray by Each Nare route daily prn.    ? ibuprofen (ADVIL,MOTRIN) 600 MG tablet Take 1 tablet (600 mg total) by mouth every 6 (six) hours. (Patient taking differently: Take 600 mg by mouth every 6 (six) hours as needed.) 30 tablet 1  ? Multiple Vitamin (MULTIVITAMIN) tablet Take 1 tablet by mouth daily.     ? ?No current facility-administered medications for this visit.  ? ? ?No Known Allergies ? ? ? ?Review of Systems negative except from HPI and PMH ? ?Physical Exam ?BP 110/72 (BP Location: Left Arm, Patient Position: Sitting, Cuff Size: Normal)   Pulse 71   Ht 5\' 1"  (1.549 m)   Wt 134 lb 6 oz (61 kg)   SpO2 98%   BMI 25.39 kg/m?  ?Well developed and nourished in no acute distress ?HENT normal ?Neck supple with JVP-  flat   ?Clear ?Regular rate and rhythm, no murmurs or gallops ?Abd-soft with active BS ?No Clubbing cyanosis edema ?Skin-warm and dry ?A & Oriented  Grossly normal sensory and motor function ? ?ECG sinus @ 71 ?13/07/41  ? ? ?Assessment and  Plan ? ?PVCs-quiescent ? ?Family history of cardiomyopathy maternal aunt with DSP gene ? ?DSP gene positive (ARVC/AC) ?  ?Without symptoms ? ?May be slight prolongation of the QRS S wave upstroke ?We will check a single lead of ECG and repeat the echocardiogram to look for any evidence of structural changes. ? ?Also discussed genetic testing in her 2 daughters.  If she elects to do this, we could have this done through Dr. 15/07/41 and if positive will refer to Dr. Jomarie Longs at [facility] ? ? One of her brothers  is gene negative the other 1 has not been tested. ? ?

## 2022-01-06 NOTE — Patient Instructions (Signed)
Medication Instructions:  ?- Your physician recommends that you continue on your current medications as directed. Please refer to the Current Medication list given to you today. ? ?*If you need a refill on your cardiac medications before your next appointment, please call your pharmacy* ? ? ?Lab Work: ?- none ordered ? ?If you have labs (blood work) drawn today and your tests are completely normal, you will receive your results only by: ?MyChart Message (if you have MyChart) OR ?A paper copy in the mail ?If you have any lab test that is abnormal or we need to change your treatment, we will call you to review the results. ? ? ?Testing/Procedures: ? ?1) Echocardiogram: ?- Your physician has requested that you have an echocardiogram. Echocardiography is a painless test that uses sound waves to create images of your heart. It provides your doctor with information about the size and shape of your heart and how well your heart?s chambers and valves are working. This procedure takes approximately one hour. There are no restrictions for this procedure. There is a possibility that an IV may need to be started during your test to inject an image enhancing agent. This is done to obtain more optimal pictures of your heart. Therefore we ask that you do at least drink some water prior to coming in to hydrate your veins.  ? ? ?2) Signal Averaged EKG: Thursday 01/13/22 @ 9:00 am (arrive at 8:45 am) ? ?[facility] ?Enter off of the [facility] side of [facility] ?Use the valet parking ?Once you enter the building your will walk straight down the hallway to the EKG department.  ? ? ?Follow-Up: ?At [facility], you and your health needs are our priority.  As part of our continuing mission to provide you with exceptional heart care, we have created designated Provider Care Teams.  These Care Teams include your primary Cardiologist (physician) and Advanced Practice Providers (APPs -  Physician Assistants and  Nurse Practitioners) who all work together to provide you with the care you need, when you need it. ? ?We recommend signing up for the patient portal called "MyChart".  Sign up information is provided on this After Visit Summary.  MyChart is used to connect with patients for Virtual Visits (Telemedicine).  Patients are able to view lab/test results, encounter notes, upcoming appointments, etc.  Non-urgent messages can be sent to your provider as well.   ?To learn more about what you can do with MyChart, go to [email or web address].   ? ?Your next appointment:   ?2 year(s) ? ?The format for your next appointment:   ?In Person ? ?Provider:   ?Sherryl Manges, MD  ? ? ?Other Instructions ? ?Echocardiogram ?An echocardiogram is a test that uses sound waves (ultrasound) to produce images of the heart. ?Images from an echocardiogram can provide important information about: ?Heart size and shape. ?The size and thickness and movement of your heart's walls. ?Heart muscle function and strength. ?Heart valve function or if you have stenosis. Stenosis is when the heart valves are too narrow. ?If blood is flowing backward through the heart valves (regurgitation). ?A tumor or infectious growth around the heart valves. ?Areas of heart muscle that are not working well because of poor blood flow or injury from a heart attack. ?Aneurysm detection. An aneurysm is a weak or damaged part of an artery wall. The wall bulges out from the normal force of blood pumping through the body. ?Tell a health care provider about: ?Any  allergies you have. ?All medicines you are taking, including vitamins, herbs, eye drops, creams, and over-the-counter medicines. ?Any blood disorders you have. ?Any surgeries you have had. ?Any medical conditions you have. ?Whether you are pregnant or may be pregnant. ?What are the risks? ?Generally, this is a safe test. However, problems may occur, including an allergic reaction to dye (contrast) that may be used  during the test. ?What happens before the test? ?No specific preparation is needed. You may eat and drink normally. ?What happens during the test? ? ?You will take off your clothes from the waist up and put on a hospital gown. ?Electrodes or electrocardiogram (ECG)patches may be placed on your chest. The electrodes or patches are then connected to a device that monitors your heart rate and rhythm. ?You will lie down on a table for an ultrasound exam. A gel will be applied to your chest to help sound waves pass through your skin. ?A handheld device, called a transducer, will be pressed against your chest and moved over your heart. The transducer produces sound waves that travel to your heart and bounce back (or "echo" back) to the transducer. These sound waves will be captured in real-time and changed into images of your heart that can be viewed on a video monitor. The images will be recorded on a computer and reviewed by your health care provider. ?You may be asked to change positions or hold your breath for a short time. This makes it easier to get different views or better views of your heart. ?In some cases, you may receive contrast through an IV in one of your veins. This can improve the quality of the pictures from your heart. ?The procedure may vary among health care providers and hospitals. ?What can I expect after the test? ?You may return to your normal, everyday life, including diet, activities, and medicines, unless your health care provider tells you not to do that. ?Follow these instructions at home: ?It is up to you to get the results of your test. Ask your health care provider, or the department that is doing the test, when your results will be ready. ?Keep all follow-up visits. This is important. ?Summary ?An echocardiogram is a test that uses sound waves (ultrasound) to produce images of the heart. ?Images from an echocardiogram can provide important information about the size and shape of your  heart, heart muscle function, heart valve function, and other possible heart problems. ?You do not need to do anything to prepare before this test. You may eat and drink normally. ?After the echocardiogram is completed, you may return to your normal, everyday life, unless your health care provider tells you not to do that. ?This information is not intended to replace advice given to you by your health care provider. Make sure you discuss any questions you have with your health care provider. ?Document Revised: 05/05/2021 Document Reviewed: 04/14/2020 ?Elsevier Patient Education ? 2023 [employer] ? ? ?Important Information About Sugar ? ? ? ? ? ? ?

## 2022-01-13 ENCOUNTER — Ambulatory Visit (HOSPITAL_COMMUNITY)
Admission: RE | Admit: 2022-01-13 | Discharge: 2022-01-13 | Disposition: A | Discharge status: Home / Self Care | Payer: BC Managed Care – PPO | Source: Ambulatory Visit | Attending: Internal Medicine | Admitting: Internal Medicine

## 2022-01-13 DIAGNOSIS — I428 Other cardiomyopathies: Secondary | ICD-10-CM | POA: Diagnosis not present

## 2022-01-27 ENCOUNTER — Ambulatory Visit (INDEPENDENT_AMBULATORY_CARE_PROVIDER_SITE_OTHER): Payer: BC Managed Care – PPO

## 2022-01-27 DIAGNOSIS — I493 Ventricular premature depolarization: Secondary | ICD-10-CM

## 2022-01-27 LAB — ECHOCARDIOGRAM COMPLETE
AR max vel: 2.53 cm2
AV Area VTI: 2.37 cm2
AV Area mean vel: 2.55 cm2
AV Mean grad: 3 mmHg
AV Peak grad: 5.7 mmHg
Ao pk vel: 1.19 m/s
Area-P 1/2: 3.18 cm2
Calc EF: 51.4 %
S' Lateral: 3.2 cm
Single Plane A2C EF: 49.5 %
Single Plane A4C EF: 51.5 %

## 2022-06-14 DIAGNOSIS — H04122 Dry eye syndrome of left lacrimal gland: Secondary | ICD-10-CM | POA: Diagnosis not present

## 2022-06-14 DIAGNOSIS — H43392 Other vitreous opacities, left eye: Secondary | ICD-10-CM | POA: Diagnosis not present

## 2022-06-14 DIAGNOSIS — Z970 Presence of artificial eye: Secondary | ICD-10-CM | POA: Diagnosis not present

## 2022-06-14 DIAGNOSIS — H35372 Puckering of macula, left eye: Secondary | ICD-10-CM | POA: Diagnosis not present

## 2022-07-06 DIAGNOSIS — H43812 Vitreous degeneration, left eye: Secondary | ICD-10-CM | POA: Diagnosis not present

## 2022-07-06 DIAGNOSIS — H35372 Puckering of macula, left eye: Secondary | ICD-10-CM | POA: Diagnosis not present

## 2022-07-06 DIAGNOSIS — Z970 Presence of artificial eye: Secondary | ICD-10-CM | POA: Diagnosis not present

## 2022-07-06 DIAGNOSIS — H43822 Vitreomacular adhesion, left eye: Secondary | ICD-10-CM | POA: Diagnosis not present

## 2022-08-03 DIAGNOSIS — D2339 Other benign neoplasm of skin of other parts of face: Secondary | ICD-10-CM | POA: Diagnosis not present

## 2022-08-03 DIAGNOSIS — L308 Other specified dermatitis: Secondary | ICD-10-CM | POA: Diagnosis not present

## 2022-08-10 DIAGNOSIS — Q111 Other anophthalmos: Secondary | ICD-10-CM | POA: Diagnosis not present

## 2022-11-23 DIAGNOSIS — Z1231 Encounter for screening mammogram for malignant neoplasm of breast: Secondary | ICD-10-CM | POA: Diagnosis not present

## 2022-11-23 DIAGNOSIS — Z13228 Encounter for screening for other metabolic disorders: Secondary | ICD-10-CM | POA: Diagnosis not present

## 2022-11-23 DIAGNOSIS — Z01419 Encounter for gynecological examination (general) (routine) without abnormal findings: Secondary | ICD-10-CM | POA: Diagnosis not present

## 2022-11-23 DIAGNOSIS — Z124 Encounter for screening for malignant neoplasm of cervix: Secondary | ICD-10-CM | POA: Diagnosis not present

## 2022-11-23 DIAGNOSIS — Z1321 Encounter for screening for nutritional disorder: Secondary | ICD-10-CM | POA: Diagnosis not present

## 2022-11-23 DIAGNOSIS — Z6826 Body mass index (BMI) 26.0-26.9, adult: Secondary | ICD-10-CM | POA: Diagnosis not present

## 2022-11-23 DIAGNOSIS — Z1329 Encounter for screening for other suspected endocrine disorder: Secondary | ICD-10-CM | POA: Diagnosis not present

## 2022-11-23 DIAGNOSIS — Z131 Encounter for screening for diabetes mellitus: Secondary | ICD-10-CM | POA: Diagnosis not present

## 2022-11-23 DIAGNOSIS — Z130 Encounter for screening for diseases of the blood and blood-forming organs and certain disorders involving the immune mechanism: Secondary | ICD-10-CM | POA: Diagnosis not present

## 2023-01-08 ENCOUNTER — Ambulatory Visit
Admission: EM | Admit: 2023-01-08 | Discharge: 2023-01-08 | Disposition: A | Discharge status: Home / Self Care | Payer: BC Managed Care – PPO | Attending: Nurse Practitioner | Admitting: Nurse Practitioner

## 2023-01-08 DIAGNOSIS — J020 Streptococcal pharyngitis: Secondary | ICD-10-CM

## 2023-01-08 LAB — POCT RAPID STREP A (OFFICE): Rapid Strep A Screen: POSITIVE — AB

## 2023-01-08 MED ORDER — AMOXICILLIN 500 MG PO CAPS: 500.0000 mg | ORAL_CAPSULE | Freq: Two times a day (BID) | ORAL | 0 refills | Status: AC

## 2023-01-08 MED ORDER — IBUPROFEN 600 MG PO TABS: 600.0000 mg | ORAL_TABLET | Freq: Four times a day (QID) | ORAL | 0 refills | Status: AC | PRN

## 2023-01-08 NOTE — ED Provider Notes (Signed)
Renaldo Fiddler    CSN: [id number] Arrival date & time: 01/08/23  4098      History   Chief Complaint Chief Complaint  Patient presents with   Sore Throat    HPI Erica Carpenter is a 47 y.o. female  presents for evaluation of URI symptoms for 3 days. Patient reports associated symptoms of sore throat, body aches, chills. Denies N/V/D, cough, congestion, ear pain, shortness of breath. Patient does not have a hx of asthma or smoking.  States children have similar symptoms.  Pt has taken out the medicine OTC for symptoms. Pt has no other concerns at this time.    Sore Throat    Past Medical History:  Diagnosis Date   Abnormal Pap smear HX OF LEEP    No pertinent past medical history    Palpitations    PVC's (premature ventricular contractions)    Vaginal delivery 05/28/2011    Patient Active Problem List   Diagnosis Date Noted   Acute recurrent pansinusitis 08/16/2018   Cough 08/16/2018   Ear pain, bilateral 08/16/2018   Normal labor 08/12/2014   Spontaneous vaginal delivery 08/12/2014    Class: Status post   Vaginal delivery 05/28/2011   PREMATURE VENTRICULAR CONTRACTIONS 07/13/2009   PALPITATIONS 07/13/2009   Avulsion of eye 08/26/2002    Past Surgical History:  Procedure Laterality Date   NO PAST SURGERIES      OB History     Gravida  2   Para  2   Term  2   Preterm      AB      Living  2      SAB      IAB      Ectopic      Multiple  0   Live Births  2            Home Medications    Prior to Admission medications   Medication Sig Start Date End Date Taking? Authorizing Provider  amoxicillin (AMOXIL) 500 MG capsule Take 1 capsule (500 mg total) by mouth 2 (two) times daily for 10 days. 01/08/23 01/18/23 Yes Radford Pax, NP  ibuprofen (ADVIL) 600 MG tablet Take 1 tablet (600 mg total) by mouth every 6 (six) hours as needed. 01/08/23  Yes Radford Pax, NP  cetirizine (ZYRTEC) 10 MG tablet Take 10 mg by mouth daily as needed for  allergies.    [provider]  fluticasone (FLONASE) 50 MCG/ACT nasal spray 1 spray by Each Nare route daily prn.    [provider]  Multiple Vitamin (MULTIVITAMIN) tablet Take 1 tablet by mouth daily.    [provider]    Family History Family History  Problem Relation Age of Onset   Heart disease Mother    Diabetes Father     Social History Social History   Tobacco Use   Smoking status: Former   Smokeless tobacco: Never  Building services engineer Use: Never used  Substance Use Topics   Alcohol use: Yes    Alcohol/week: 2.0 standard drinks of alcohol    Types: 2 Glasses of wine per week    Comment: occasionally   Drug use: No     Allergies   Patient has no known allergies.   Review of Systems Review of Systems  Constitutional:  Positive for chills.  HENT:  Positive for sore throat.   Musculoskeletal:  Positive for myalgias.     Physical Exam Triage Vital Signs ED Triage  Vitals [01/08/23 0824]  Enc Vitals Group     BP 119/85     Pulse Rate 85     Resp 18     Temp 99 F (37.2 C)     Temp src      SpO2 98 %     Weight      Height      Head Circumference      Peak Flow      Pain Score 8     Pain Loc      Pain Edu?      Excl. in GC?    No data found.  Updated Vital Signs BP 119/85   Pulse 85   Temp 99 F (37.2 C)   Resp 18   SpO2 98%   Visual Acuity Right Eye Distance:   Left Eye Distance:   Bilateral Distance:    Right Eye Near:   Left Eye Near:    Bilateral Near:     Physical Exam Vitals and nursing note reviewed.  Constitutional:      General: She is not in acute distress.    Appearance: She is well-developed. She is not ill-appearing.  HENT:     Head: Normocephalic and atraumatic.     Right Ear: Tympanic membrane and ear canal normal.     Left Ear: Tympanic membrane and ear canal normal.     Nose: No congestion or rhinorrhea.     Mouth/Throat:     Mouth: Mucous membranes are moist.     Pharynx:  Oropharynx is clear. Uvula midline. Posterior oropharyngeal erythema present.     Tonsils: No tonsillar exudate or tonsillar abscesses. 2+ on the right. 2+ on the left.  Eyes:     Conjunctiva/sclera: Conjunctivae normal.     Pupils: Pupils are equal, round, and reactive to light.  Cardiovascular:     Rate and Rhythm: Normal rate and regular rhythm.     Heart sounds: Normal heart sounds.  Pulmonary:     Effort: Pulmonary effort is normal.     Breath sounds: Normal breath sounds.  Musculoskeletal:     Cervical back: Normal range of motion and neck supple.  Lymphadenopathy:     Cervical: No cervical adenopathy.  Skin:    General: Skin is warm and dry.  Neurological:     General: No focal deficit present.     Mental Status: She is alert and oriented to person, place, and time.  Psychiatric:        Mood and Affect: Mood normal.        Behavior: Behavior normal.      UC Treatments / Results  Labs (all labs ordered are listed, but only abnormal results are displayed) Labs Reviewed  POCT RAPID STREP A (OFFICE) - Abnormal; Notable for the following components:      Result Value   Rapid Strep A Screen Positive (*)    All other components within normal limits    EKG   Radiology No results found.  Procedures Procedures (including critical care time)  Medications Ordered in UC Medications - No data to display  Initial Impression / Assessment and Plan / UC Course  I have reviewed the triage vital signs and the nursing notes.  Pertinent labs & imaging results that were available during my care of the patient were reviewed by me and considered in my medical decision making (see chart for details).     Positive rapid strep.  Start amoxicillin Ibuprofen as needed Salt  water gargles and warm liquids PCP follow-up if symptoms do not improve ER precautions reviewed and patient verbalized understanding Final Clinical Impressions(s) / UC Diagnoses   Final diagnoses:   Streptococcal sore throat     Discharge Instructions      Your strep throat test was positive for strep throat.  Start amoxicillin twice daily for 10 days Ibuprofen as needed for pain.  You may also do warm liquids and salt water gargles Rest and fluids Follow-up with your PCP if your symptoms do not improve Please go to emergency room if you develop any worsening symptoms   ED Prescriptions     Medication Sig Dispense Auth. Provider   amoxicillin (AMOXIL) 500 MG capsule Take 1 capsule (500 mg total) by mouth 2 (two) times daily for 10 days. 20 capsule Radford Pax, NP   ibuprofen (ADVIL) 600 MG tablet Take 1 tablet (600 mg total) by mouth every 6 (six) hours as needed. 15 tablet Radford Pax, NP      PDMP not reviewed this encounter.   Radford Pax, NP 01/08/23 [phone]

## 2023-01-08 NOTE — ED Triage Notes (Addendum)
Patient to Urgent Care with complaints of sore throat that started on Friday. Painful swallowing. Reports body aches/ chills/ sweats on Friday night.   Reports her children recently sick with same symptoms.  Has been taking zyrtec/ ibuprofen.

## 2023-01-08 NOTE — Discharge Instructions (Signed)
Your strep throat test was positive for strep throat.  Start amoxicillin twice daily for 10 days Ibuprofen as needed for pain.  You may also do warm liquids and salt water gargles Rest and fluids Follow-up with your PCP if your symptoms do not improve Please go to emergency room if you develop any worsening symptoms

## 2023-02-03 DIAGNOSIS — D485 Neoplasm of uncertain behavior of skin: Secondary | ICD-10-CM | POA: Diagnosis not present

## 2023-02-03 DIAGNOSIS — D2339 Other benign neoplasm of skin of other parts of face: Secondary | ICD-10-CM | POA: Diagnosis not present

## 2023-04-10 DIAGNOSIS — Z1211 Encounter for screening for malignant neoplasm of colon: Secondary | ICD-10-CM | POA: Diagnosis not present

## 2023-04-24 LAB — COLOGUARD: COLOGUARD: NEGATIVE

## 2023-05-29 DIAGNOSIS — Q111 Other anophthalmos: Secondary | ICD-10-CM | POA: Diagnosis not present

## 2023-06-08 DIAGNOSIS — H01006 Unspecified blepharitis left eye, unspecified eyelid: Secondary | ICD-10-CM | POA: Diagnosis not present

## 2023-06-08 DIAGNOSIS — H1045 Other chronic allergic conjunctivitis: Secondary | ICD-10-CM | POA: Diagnosis not present

## 2023-06-08 DIAGNOSIS — H43392 Other vitreous opacities, left eye: Secondary | ICD-10-CM | POA: Diagnosis not present

## 2023-06-08 DIAGNOSIS — H10239 Serous conjunctivitis, except viral, unspecified eye: Secondary | ICD-10-CM | POA: Diagnosis not present

## 2023-06-27 DIAGNOSIS — H01006 Unspecified blepharitis left eye, unspecified eyelid: Secondary | ICD-10-CM | POA: Diagnosis not present

## 2023-06-27 DIAGNOSIS — H43392 Other vitreous opacities, left eye: Secondary | ICD-10-CM | POA: Diagnosis not present

## 2023-06-27 DIAGNOSIS — H35372 Puckering of macula, left eye: Secondary | ICD-10-CM | POA: Diagnosis not present

## 2023-06-27 DIAGNOSIS — H1045 Other chronic allergic conjunctivitis: Secondary | ICD-10-CM | POA: Diagnosis not present

## 2023-07-18 DIAGNOSIS — H35372 Puckering of macula, left eye: Secondary | ICD-10-CM | POA: Diagnosis not present

## 2023-07-18 DIAGNOSIS — Z970 Presence of artificial eye: Secondary | ICD-10-CM | POA: Diagnosis not present

## 2023-07-18 DIAGNOSIS — H43822 Vitreomacular adhesion, left eye: Secondary | ICD-10-CM | POA: Diagnosis not present

## 2023-08-09 DIAGNOSIS — D2261 Melanocytic nevi of right upper limb, including shoulder: Secondary | ICD-10-CM | POA: Diagnosis not present

## 2023-08-09 DIAGNOSIS — D2262 Melanocytic nevi of left upper limb, including shoulder: Secondary | ICD-10-CM | POA: Diagnosis not present

## 2023-08-09 DIAGNOSIS — D225 Melanocytic nevi of trunk: Secondary | ICD-10-CM | POA: Diagnosis not present

## 2023-08-09 DIAGNOSIS — D2272 Melanocytic nevi of left lower limb, including hip: Secondary | ICD-10-CM | POA: Diagnosis not present
